# Patient Record
Sex: Male | Born: 1940 | Race: White | Hispanic: No | Marital: Married | Smoking: Former smoker
Health system: Southern US, Community
[De-identification: ages and names within clinical notes are randomized; demographics above are authoritative.]

## PROBLEM LIST (undated history)

## (undated) DIAGNOSIS — I2510 Atherosclerotic heart disease of native coronary artery without angina pectoris: Secondary | ICD-10-CM

## (undated) DIAGNOSIS — I10 Essential (primary) hypertension: Secondary | ICD-10-CM

## (undated) DIAGNOSIS — H547 Unspecified visual loss: Secondary | ICD-10-CM

## (undated) DIAGNOSIS — I509 Heart failure, unspecified: Secondary | ICD-10-CM

## (undated) HISTORY — PX: EYE SURGERY: SHX253

---

## 2021-04-19 ENCOUNTER — Emergency Department (HOSPITAL_COMMUNITY): Payer: Medicare Other

## 2021-04-19 ENCOUNTER — Inpatient Hospital Stay (HOSPITAL_COMMUNITY)
Admission: EM | Admit: 2021-04-19 | Discharge: 2021-04-22 | DRG: 445 | Disposition: A | Discharge status: Home / Self Care | Payer: Medicare Other | Attending: Family Medicine | Admitting: Family Medicine

## 2021-04-19 ENCOUNTER — Encounter (HOSPITAL_COMMUNITY): Payer: Self-pay

## 2021-04-19 ENCOUNTER — Inpatient Hospital Stay (HOSPITAL_COMMUNITY): Payer: Medicare Other

## 2021-04-19 ENCOUNTER — Other Ambulatory Visit: Payer: Self-pay

## 2021-04-19 DIAGNOSIS — Z800 Family history of malignant neoplasm of digestive organs: Secondary | ICD-10-CM | POA: Diagnosis not present

## 2021-04-19 DIAGNOSIS — Z880 Allergy status to penicillin: Secondary | ICD-10-CM | POA: Diagnosis not present

## 2021-04-19 DIAGNOSIS — E785 Hyperlipidemia, unspecified: Secondary | ICD-10-CM

## 2021-04-19 DIAGNOSIS — R932 Abnormal findings on diagnostic imaging of liver and biliary tract: Secondary | ICD-10-CM | POA: Diagnosis present

## 2021-04-19 DIAGNOSIS — R109 Unspecified abdominal pain: Secondary | ICD-10-CM | POA: Diagnosis not present

## 2021-04-19 DIAGNOSIS — Z20822 Contact with and (suspected) exposure to covid-19: Secondary | ICD-10-CM | POA: Diagnosis present

## 2021-04-19 DIAGNOSIS — K2270 Barrett's esophagus without dysplasia: Secondary | ICD-10-CM | POA: Diagnosis present

## 2021-04-19 DIAGNOSIS — H409 Unspecified glaucoma: Secondary | ICD-10-CM | POA: Diagnosis present

## 2021-04-19 DIAGNOSIS — K8689 Other specified diseases of pancreas: Secondary | ICD-10-CM | POA: Diagnosis present

## 2021-04-19 DIAGNOSIS — K2970 Gastritis, unspecified, without bleeding: Secondary | ICD-10-CM | POA: Diagnosis present

## 2021-04-19 DIAGNOSIS — I10 Essential (primary) hypertension: Secondary | ICD-10-CM | POA: Diagnosis present

## 2021-04-19 DIAGNOSIS — K219 Gastro-esophageal reflux disease without esophagitis: Secondary | ICD-10-CM | POA: Diagnosis present

## 2021-04-19 DIAGNOSIS — K831 Obstruction of bile duct: Principal | ICD-10-CM | POA: Diagnosis present

## 2021-04-19 DIAGNOSIS — I2510 Atherosclerotic heart disease of native coronary artery without angina pectoris: Secondary | ICD-10-CM | POA: Diagnosis present

## 2021-04-19 DIAGNOSIS — N132 Hydronephrosis with renal and ureteral calculous obstruction: Secondary | ICD-10-CM | POA: Diagnosis present

## 2021-04-19 DIAGNOSIS — Z8249 Family history of ischemic heart disease and other diseases of the circulatory system: Secondary | ICD-10-CM | POA: Diagnosis not present

## 2021-04-19 DIAGNOSIS — H547 Unspecified visual loss: Secondary | ICD-10-CM | POA: Diagnosis present

## 2021-04-19 DIAGNOSIS — E876 Hypokalemia: Secondary | ICD-10-CM | POA: Diagnosis present

## 2021-04-19 DIAGNOSIS — N400 Enlarged prostate without lower urinary tract symptoms: Secondary | ICD-10-CM | POA: Diagnosis present

## 2021-04-19 DIAGNOSIS — R7989 Other specified abnormal findings of blood chemistry: Secondary | ICD-10-CM | POA: Diagnosis present

## 2021-04-19 DIAGNOSIS — R911 Solitary pulmonary nodule: Secondary | ICD-10-CM | POA: Diagnosis present

## 2021-04-19 DIAGNOSIS — Z87891 Personal history of nicotine dependence: Secondary | ICD-10-CM

## 2021-04-19 DIAGNOSIS — N179 Acute kidney failure, unspecified: Secondary | ICD-10-CM

## 2021-04-19 DIAGNOSIS — Z419 Encounter for procedure for purposes other than remedying health state, unspecified: Secondary | ICD-10-CM

## 2021-04-19 DIAGNOSIS — C259 Malignant neoplasm of pancreas, unspecified: Secondary | ICD-10-CM | POA: Diagnosis present

## 2021-04-19 DIAGNOSIS — R52 Pain, unspecified: Secondary | ICD-10-CM

## 2021-04-19 HISTORY — DX: Atherosclerotic heart disease of native coronary artery without angina pectoris: I25.10

## 2021-04-19 HISTORY — DX: Unspecified visual loss: H54.7

## 2021-04-19 HISTORY — DX: Essential (primary) hypertension: I10

## 2021-04-19 HISTORY — DX: Heart failure, unspecified: I50.9

## 2021-04-19 LAB — URINALYSIS, ROUTINE W REFLEX MICROSCOPIC
Glucose, UA: NEGATIVE mg/dL
Ketones, ur: 5 mg/dL — AB
Leukocytes,Ua: NEGATIVE
Nitrite: NEGATIVE
Protein, ur: 30 mg/dL — AB
RBC / HPF: >50 RBC/hpf — ABNORMAL HIGH (ref 0–5)
Specific Gravity, Urine: 1.017 (ref 1.005–1.030)
pH: 5 (ref 5.0–8.0)

## 2021-04-19 LAB — COMPREHENSIVE METABOLIC PANEL
ALT: 549 U/L — ABNORMAL HIGH (ref 0–44)
AST: 554 U/L — ABNORMAL HIGH (ref 15–41)
Albumin: 3.8 g/dL (ref 3.5–5.0)
Alkaline Phosphatase: 647 U/L — ABNORMAL HIGH (ref 38–126)
Anion gap: 9 (ref 5–15)
BUN: 25 mg/dL — ABNORMAL HIGH (ref 8–23)
CO2: 27 mmol/L (ref 22–32)
Calcium: 8.8 mg/dL — ABNORMAL LOW (ref 8.9–10.3)
Chloride: 107 mmol/L (ref 98–111)
Creatinine, Ser: 1.76 mg/dL — ABNORMAL HIGH (ref 0.61–1.24)
GFR, Estimated: 39 mL/min — ABNORMAL LOW (ref >60)
Glucose, Bld: 139 mg/dL — ABNORMAL HIGH (ref 70–99)
Potassium: 3 mmol/L — ABNORMAL LOW (ref 3.5–5.1)
Sodium: 143 mmol/L (ref 135–145)
Total Bilirubin: 8.6 mg/dL — ABNORMAL HIGH (ref 0.3–1.2)
Total Protein: 7.4 g/dL (ref 6.5–8.1)

## 2021-04-19 LAB — CBC WITH DIFFERENTIAL/PLATELET
Abs Immature Granulocytes: 0.02 10*3/uL (ref 0.00–0.07)
Basophils Absolute: 0 10*3/uL (ref 0.0–0.1)
Basophils Relative: 1 %
Eosinophils Absolute: 0.1 10*3/uL (ref 0.0–0.5)
Eosinophils Relative: 1 %
HCT: 37.9 % — ABNORMAL LOW (ref 39.0–52.0)
Hemoglobin: 12.8 g/dL — ABNORMAL LOW (ref 13.0–17.0)
Immature Granulocytes: 0 %
Lymphocytes Relative: 14 %
Lymphs Abs: 0.9 10*3/uL (ref 0.7–4.0)
MCH: 31.4 pg (ref 26.0–34.0)
MCHC: 33.8 g/dL (ref 30.0–36.0)
MCV: 93.1 fL (ref 80.0–100.0)
Monocytes Absolute: 0.8 10*3/uL (ref 0.1–1.0)
Monocytes Relative: 12 %
Neutro Abs: 4.9 10*3/uL (ref 1.7–7.7)
Neutrophils Relative %: 72 %
Platelets: 151 10*3/uL (ref 150–400)
RBC: 4.07 MIL/uL — ABNORMAL LOW (ref 4.22–5.81)
RDW: 13.7 % (ref 11.5–15.5)
WBC: 6.8 10*3/uL (ref 4.0–10.5)
nRBC: 0 % (ref 0.0–0.2)

## 2021-04-19 MED ORDER — POTASSIUM CHLORIDE 10 MEQ/100ML IV SOLN
10.0000 meq | INTRAVENOUS | Status: AC
  Administered 2021-04-20 (×3): 10 meq via INTRAVENOUS
  Filled 2021-04-19 (×4): qty 100

## 2021-04-19 MED ORDER — TAMSULOSIN HCL 0.4 MG PO CAPS
0.4000 mg | ORAL_CAPSULE | Freq: Every day | ORAL | Status: DC
  Administered 2021-04-20 – 2021-04-21 (×2): 0.4 mg via ORAL
  Filled 2021-04-19 (×3): qty 1

## 2021-04-19 MED ORDER — SODIUM CHLORIDE 0.9 % IV BOLUS
500.0000 mL | Freq: Once | INTRAVENOUS | Status: AC
  Administered 2021-04-19: 500 mL via INTRAVENOUS

## 2021-04-19 NOTE — ED Provider Notes (Signed)
[facility] DEPT Provider Note   CSN: [id number] Arrival date & time: 04/19/21  1919     History Chief Complaint  Patient presents with  . Flank Pain    Maurice Schneider is a 80 y.o. male.  Patient presents with flank pain for couple days and dark urine.  He has also had some abdominal discomfort for the last week and a half   Flank Pain       Past Medical History:  Diagnosis Date  . Blind   . CHF (congestive heart failure) ([place])   . Coronary artery disease   . Hypertension     Patient Active Problem List   Diagnosis Date Noted  . Hydronephrosis with renal and ureteral calculus obstruction 04/19/2021  . Pancreatic mass 04/19/2021  . Hypokalemia 04/19/2021  . AKI (acute kidney injury) ([place]) 04/19/2021  . Elevated LFTs 04/19/2021    History reviewed. No pertinent surgical history.     History reviewed. No pertinent family history.     Home Medications Prior to Admission medications   Not on File    Allergies    Penicillins  Review of Systems   Review of Systems  Genitourinary: Positive for flank pain.    Physical Exam Updated Vital Signs BP (!) 146/76   Pulse 62   Temp 97.8 F (36.6 C) (Oral)   Resp 18   Ht 5' 6.5" (1.689 m)   Wt 77.1 kg   SpO2 97%   BMI 27.03 kg/m   Physical Exam  ED Results / Procedures / Treatments   Labs (all labs ordered are listed, but only abnormal results are displayed) Labs Reviewed  CBC WITH DIFFERENTIAL/PLATELET - Abnormal; Notable for the following components:      Result Value   RBC 4.07 (*)    Hemoglobin 12.8 (*)    HCT 37.9 (*)    All other components within normal limits  COMPREHENSIVE METABOLIC PANEL - Abnormal; Notable for the following components:   Potassium 3.0 (*)    Glucose, Bld 139 (*)    BUN 25 (*)    Creatinine, Ser 1.76 (*)    Calcium 8.8 (*)    AST 554 (*)    ALT 549 (*)    Alkaline Phosphatase 647 (*)    Total Bilirubin 8.6 (*)    GFR, Estimated  39 (*)    All other components within normal limits  URINALYSIS, ROUTINE W REFLEX MICROSCOPIC - Abnormal; Notable for the following components:   Color, Urine AMBER (*)    APPearance HAZY (*)    Hgb urine dipstick MODERATE (*)    Bilirubin Urine SMALL (*)    Ketones, ur 5 (*)    Protein, ur 30 (*)    RBC / HPF >50 (*)    Bacteria, UA RARE (*)    All other components within normal limits  SARS CORONAVIRUS 2 (TAT 6-24 HRS)  URINE CULTURE  MAGNESIUM  CK  LIPASE, BLOOD  SODIUM, URINE, RANDOM  OSMOLALITY, URINE  CREATININE, URINE, RANDOM    EKG None  Radiology CT ABDOMEN PELVIS WO CONTRAST  Result Date: 04/19/2021 CLINICAL DATA:  Abdominal abscess/infection suspected Flank pain with dark urine EXAM: CT ABDOMEN AND PELVIS WITHOUT CONTRAST TECHNIQUE: Multidetector CT imaging of the abdomen and pelvis was performed following the standard protocol without IV contrast. COMPARISON:  None. FINDINGS: Lower chest: Peripheral honeycombing versus emphysema in the right lower lobe. Mild bilateral lower lobe bronchiectasis. No acute airspace disease. Normal heart size with coronary  artery calcifications. Hepatobiliary: No focal liver abnormality on this noncontrast exam. Diffuse high-density contents of the gallbladder, may be stones/sludge. No pericholecystic fat stranding or inflammation. There is proximal common bile duct dilatation measuring up to 17 mm with truncation distally. No visualized choledocholithiasis. Pancreas: Parenchymal atrophy. There is rounded soft tissue fullness in the pancreatic head spanning 2.9 cm. Mild adjacent fat stranding about the pancreatic head and uncinate process. Proximal pancreatic ductal dilatation at 5 mm. Spleen: Normal in size without focal abnormality. Adrenals/Urinary Tract: Normal adrenal glands. Mild right hydroureteronephrosis secondary to a 4 mm obstructing stone in the right mid ureter, series 2, image 50. Minimal right perinephric edema. More distal ureters  decompressed. No definite additional intrarenal calculi or focal intrarenal lesion. Cystic changes at the left renal hilum likely represent cortical and parapelvic cysts. This includes an exophytic cyst arising from the lower kidney measuring 9.6 cm. Left intrarenal calculi, largest in the mid kidney measures 8 mm. No ureteral stone. Partially distended urinary bladder without bladder stone or wall thickening. Stomach/Bowel: Small hiatal hernia. Ingested material distends the stomach. No small bowel obstruction or inflammation. Normal appendix. Diverticulosis of the descending and sigmoid colon, prominent in the sigmoid. No diverticulitis. Vascular/Lymphatic: Moderate aortic atherosclerosis without aneurysm. There may be a small peripancreatic nodes, not well assessed on this noncontrast exam. Reproductive: Prominent prostate gland spans 4.8 cm and causes mild mass effect on the bladder base. Other: No ascites or free air. Tiny fat containing umbilical hernia. 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 No acute osseous abnormality or evidence of focal bone lesion. IMPRESSION: 1. Mild right hydroureteronephrosis secondary to a 4 mm obstructing stone in the right mid ureter. 2. Rounded soft tissue fullness in the pancreatic head spanning 2.9 cm with mild adjacent fat stranding about the pancreatic head and uncinate process. Recommend further evaluation with pancreatic protocol MRI after resolution of acute event. There is proximal pancreatic as well as common bile duct dilatation. There may be a small peripancreatic nodes, not well assessed on this noncontrast exam. 3. High-density contents of the gallbladder, may be stones/sludge. No pericholecystic fat stranding or inflammation. 4. Left intrarenal calculi. Left renal cortical and parapelvic cysts. 5. Colonic diverticulosis without diverticulitis. 6. Enlarged prostate gland causing  mild mass effect on the bladder base. 7. 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 Aortic Atherosclerosis (ICD10-I70.0). Electronically Signed   By: Keith Rake M.D.   On: 04/19/2021 20:51    Procedures Procedures   Medications Ordered in ED Medications  tamsulosin (FLOMAX) capsule 0.4 mg (has no administration in time range)  potassium chloride 10 mEq in 100 mL IVPB (has no administration in time range)  sodium chloride 0.9 % bolus 500 mL (0 mLs Intravenous Stopped 04/19/21 2306)    ED Course  I have reviewed the triage vital signs and the nursing notes.  Pertinent labs & imaging results that were available during my care of the patient were reviewed by me and considered in my medical decision making (see chart for details).    MDM Rules/Calculators/A&P                          Patient has a kidney stone and a pancreatic mass with hyperbilirubinemia.  He will be admitted to medicine with GI consult and possible urology involved to Final Clinical Impression(s) / ED Diagnoses Final diagnoses:  Flank  pain    Rx / DC Orders ED Discharge Orders    None       Milton Ferguson, MD 04/25/21 1245

## 2021-04-19 NOTE — H&P (Signed)
Maurice Schneider FVC:[id number] DOB: [birth year] DOA: 04/19/2021    PCP: Administration, [place] [facility] Specialists:  NONE    Patient arrived to ER on 04/19/21 at 1919 Referred by Attending Maurice Ferguson, MD   Patient coming from: home Lives  With family    Chief Complaint:  Chief Complaint  Patient presents with  . Flank Pain    HPI: Maurice Schneider is a 80 y.o. male with medical history significant of glaucoma, HTN, HLD, barrets esophagus, kidney stones, blindness, CAD    Presented with few days of dark urine and flank pain Also had epigastric pain for about 1 wk Recently moved here from [place] No fever or chills Lost about 5 lb Last coloscopy was 6 y ago and was normal done in [place] Just today was checked for COVID and was negative Has   been vaccinated against COVID  and boosted   Initial COVID TEST   in house  PCR testing  Pending  No results found for: SARSCOV2NAA   Regarding pertinent Chronic problems:    Hyperlipidemia -  on statins lipitor Lipid Panel  No results found for: CHOL, TRIG, HDL, CHOLHDL, VLDL, LDLCALC, LDLDIRECT, LABVLDL  HTN on metoprolol, losartan  GERD - on PPI   CAD  - On Aspirin, statin, betablocker,                  -  followed by cardiology in [place]                - last cardiac cath sp 3 stents         CKD family states he had neck nephritis as a child but is unsure if he has any underlying kidney disease currently       While in ER: UA worrisome for UTI CT abdomen done showing 4 mm obstructing stone in the right mid ureter But there was also noted air rounded soft tissue fullness in the pancreatic head 2.9 cm will need follow-up MRI Enlarged prostate    ED Triage Vitals  Enc Vitals Group     BP 04/19/21 1929 (!) 150/75     Pulse Rate 04/19/21 1929 60     Resp 04/19/21 1929 14     Temp 04/19/21 1929 97.8 F (36.6 C)     Temp Source 04/19/21 1929 Oral     SpO2 04/19/21 1929 98 %      Weight 04/19/21 1929 170 lb (77.1 kg)     Height 04/19/21 1929 5' 6.5" (1.689 m)     Head Circumference --      Peak Flow --      Pain Score 04/19/21 1935 0     Pain Loc --      Pain Edu? --      Excl. in [place]? --   TMAX(24)@     _________________________________________ Significant initial  Findings: Abnormal Labs Reviewed  CBC WITH DIFFERENTIAL/PLATELET - Abnormal; Notable for the following components:      Result Value   RBC 4.07 (*)    Hemoglobin 12.8 (*)    HCT 37.9 (*)    All other components within normal limits  COMPREHENSIVE METABOLIC PANEL - Abnormal; Notable for the following components:   Potassium 3.0 (*)    Glucose, Bld 139 (*)    BUN 25 (*)    Creatinine, Ser 1.76 (*)    Calcium 8.8 (*)    AST 554 (*)    ALT 549 (*)  Alkaline Phosphatase 647 (*)    Total Bilirubin 8.6 (*)    GFR, Estimated 39 (*)    All other components within normal limits  URINALYSIS, ROUTINE W REFLEX MICROSCOPIC - Abnormal; Notable for the following components:   Color, Urine AMBER (*)    APPearance HAZY (*)    Hgb urine dipstick MODERATE (*)    Bilirubin Urine SMALL (*)    Ketones, ur 5 (*)    Protein, ur 30 (*)    RBC / HPF >50 (*)    Bacteria, UA RARE (*)    All other components within normal limits   ____________________________________________ Ordered    CXR -9 mm right upper lobe pulmonary nodule  CTabd/pelvis - Mild right hydroureteronephrosis secondary to a 4 mm obstructing stone in the right mid ureter Rounded soft tissue fullness in the pancreatic head spanning 2.9 cm with mild adjacent fat stranding about the pancreatic head and uncinate process. Recommend further evaluation with pancreatic protocol MRI after resolution of acute event. There is proximal pancreatic as well as common bile duct dilatation. There may be a small peripancreatic nodes, not well assessed on this noncontrast exam.     ECG: Ordered Personally reviewed by me showing: HR : 65 Rhythm:  NSR,    nonspecific changes,   [id number]   The recent clinical data is shown below. Vitals:   04/19/21 1929 04/19/21 1945 04/19/21 2134 04/19/21 2230  BP: (!) 150/75 133/74 130/66 (!) 146/76  Pulse: 60 60 63 62  Resp: 14 16 18 18   Temp: 97.8 F (36.6 C)     TempSrc: Oral     SpO2: 98% 94% 97% 97%  Weight: 77.1 kg     Height: 5' 6.5" (1.689 m)       WBC     Component Value Date/Time   WBC 6.8 04/19/2021 2059   LYMPHSABS 0.9 04/19/2021 2059   MONOABS 0.8 04/19/2021 2059   EOSABS 0.1 04/19/2021 2059   BASOSABS 0.0 04/19/2021 2059      Procalcitonin   Ordered   UA   no evidence of UTI    Urine analysis:    Component Value Date/Time   COLORURINE AMBER (A) 04/19/2021 2134   APPEARANCEUR HAZY (A) 04/19/2021 2134   LABSPEC 1.017 04/19/2021 2134   PHURINE 5.0 04/19/2021 2134   GLUCOSEU NEGATIVE 04/19/2021 2134   HGBUR MODERATE (A) 04/19/2021 2134   BILIRUBINUR SMALL (A) 04/19/2021 2134   KETONESUR 5 (A) 04/19/2021 2134   PROTEINUR 30 (A) 04/19/2021 2134   NITRITE NEGATIVE 04/19/2021 2134   LEUKOCYTESUR NEGATIVE 04/19/2021 2134   Urince culture ordered No results found for this or any previous visit.   _______________________________________________ Hospitalist was called for admission for pancreatic mass with elevated LFTs Right ureteral stone with mild hydronephrosis and AKI The following Work up has been ordered so far:  Orders Placed This Encounter  Procedures  . CT ABDOMEN PELVIS WO CONTRAST  . CBC with Differential/Platelet  . Comprehensive metabolic panel  . Urinalysis, Routine w reflex microscopic  . Consult to hospitalist    Following Medications were ordered in ER: Medications  sodium chloride 0.9 % bolus 500 mL (0 mLs Intravenous Stopped 04/19/21 2306)        Consult Orders  (From admission, onward)         Start     Ordered   04/19/21 2259  Consult to hospitalist  Once       Provider:  (Not yet assigned)  Question Answer Comment  Place call to:  Maurice Hospitalist,   call [phone]   Reason for Consult Admit      04/19/21 2259            OTHER Significant initial  Findings:  labs showing:    Recent Labs  Lab 04/19/21 2059  NA 143  K 3.0*  CO2 27  GLUCOSE 139*  BUN 25*  CREATININE 1.76*  CALCIUM 8.8*    Cr  Lab Results  Component Value Date   CREATININE 1.76 (H) 04/19/2021    Recent Labs  Lab 04/19/21 2059  AST 554*  ALT 549*  ALKPHOS 647*  BILITOT 8.6*  PROT 7.4  ALBUMIN 3.8   Lab Results  Component Value Date   CALCIUM 8.8 (L) 04/19/2021          Plt: Lab Results  Component Value Date   PLT 151 04/19/2021         Recent Labs  Lab 04/19/21 2059  WBC 6.8  NEUTROABS 4.9  HGB 12.8*  HCT 37.9*  MCV 93.1  PLT 151    HG/HCT stable,      Component Value Date/Time   HGB 12.8 (L) 04/19/2021 2059   HCT 37.9 (L) 04/19/2021 2059   MCV 93.1 04/19/2021 2059       Cultures: No results found for: SDES, SPECREQUEST, CULT, REPTSTATUS   Radiological Exams on Admission: CT ABDOMEN PELVIS WO CONTRAST  Result Date: 04/19/2021 CLINICAL DATA:  Abdominal abscess/infection suspected Flank pain with dark urine EXAM: CT ABDOMEN AND PELVIS WITHOUT CONTRAST TECHNIQUE: Multidetector CT imaging of the abdomen and pelvis was performed following the standard protocol without IV contrast. COMPARISON:  None. FINDINGS: Lower chest: Peripheral honeycombing versus emphysema in the right lower lobe. Mild bilateral lower lobe bronchiectasis. No acute airspace disease. Normal heart size with coronary artery calcifications. Hepatobiliary: No focal liver abnormality on this noncontrast exam. Diffuse high-density contents of the gallbladder, may be stones/sludge. No pericholecystic fat stranding or inflammation. There is proximal common bile duct dilatation measuring up to 17 mm with truncation distally. No visualized choledocholithiasis. Pancreas: Parenchymal atrophy. There is rounded soft tissue fullness in the pancreatic  head spanning 2.9 cm. Mild adjacent fat stranding about the pancreatic head and uncinate process. Proximal pancreatic ductal dilatation at 5 mm. Spleen: Normal in size without focal abnormality. Adrenals/Urinary Tract: Normal adrenal glands. Mild right hydroureteronephrosis secondary to a 4 mm obstructing stone in the right mid ureter, series 2, image 50. Minimal right perinephric edema. More distal ureters decompressed. No definite additional intrarenal calculi or focal intrarenal lesion. Cystic changes at the left renal hilum likely represent cortical and parapelvic cysts. This includes an exophytic cyst arising from the lower kidney measuring 9.6 cm. Left intrarenal calculi, largest in the mid kidney measures 8 mm. No ureteral stone. Partially distended urinary bladder without bladder stone or wall thickening. Stomach/Bowel: Small hiatal hernia. Ingested material distends the stomach. No small bowel obstruction or inflammation. Normal appendix. Diverticulosis of the descending and sigmoid colon, prominent in the sigmoid. No diverticulitis. Vascular/Lymphatic: Moderate aortic atherosclerosis without aneurysm. There may be a small peripancreatic nodes, not well assessed on this noncontrast exam. Reproductive: Prominent prostate gland spans 4.8 cm and causes mild mass effect on the bladder base. Other: No ascites or free air. Tiny fat containing umbilical hernia. 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 No acute osseous abnormality or evidence of focal bone lesion. IMPRESSION: 1. Mild right hydroureteronephrosis secondary to a 4  mm obstructing stone in the right mid ureter. 2. Rounded soft tissue fullness in the pancreatic head spanning 2.9 cm with mild adjacent fat stranding about the pancreatic head and uncinate process. Recommend further evaluation with pancreatic protocol MRI after resolution of acute event. There is proximal  pancreatic as well as common bile duct dilatation. There may be a small peripancreatic nodes, not well assessed on this noncontrast exam. 3. High-density contents of the gallbladder, may be stones/sludge. No pericholecystic fat stranding or inflammation. 4. Left intrarenal calculi. Left renal cortical and parapelvic cysts. 5. Colonic diverticulosis without diverticulitis. 6. Enlarged prostate gland causing mild mass effect on the bladder base. 7. 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 Aortic Atherosclerosis (ICD10-I70.0). Electronically Signed   By: Maurice Schneider M.D.   On: 04/19/2021 20:51   DG Chest 2 View  Result Date: 04/20/2021 CLINICAL DATA:  Pancreatic mass EXAM: CHEST - 2 VIEW COMPARISON:  None. FINDINGS: The lungs are symmetrically expanded. A 9 mm indeterminate nodule is seen within the a right apex. The lungs are otherwise clear. No pneumothorax or pleural effusion. Cardiac size within normal limits. Pulmonary vascularity is normal. Osseous structures are age-appropriate. No acute bone abnormality. IMPRESSION: 9 mm indeterminate right upper lobe pulmonary nodule. Dedicated CT imaging is recommended for further evaluation if no prior examinations are available to establish chronicity. Electronically Signed   By: Maurice Salisbury MD   On: 04/20/2021 00:21   _______________________________________________________________________________________________________ Latest  Blood pressure (!) 146/76, pulse 62, temperature 97.8 F (36.6 C), temperature source Oral, resp. rate 18, height 5' 6.5" (1.689 m), weight 77.1 kg, SpO2 97 %.   Review of Systems:    Pertinent positives include:   fatigue, weight loss  jaundice  Constitutional:  No weight loss, night sweats, Fevers, chills,  HEENT:  No headaches, Difficulty swallowing,Tooth/dental problems,Sore throat,  No sneezing, itching, ear ache, nasal congestion, post nasal drip,   Cardio-vascular:  No chest pain, Orthopnea, PND, anasarca, dizziness, palpitations.no Bilateral lower extremity swelling  GI:  No heartburn, indigestion, abdominal pain, nausea, vomiting, diarrhea, change in bowel habits, loss of appetite, melena, blood in stool, hematemesis Resp:  no shortness of breath at rest. No dyspnea on exertion, No excess mucus, no productive cough, No non-productive cough, No coughing up of blood.No change in color of mucus.No wheezing. Skin:  no rash or lesions. No GU:  no dysuria, change in color of urine, no urgency or frequency. No straining to urinate.  No flank pain.  Musculoskeletal:  No joint pain or no joint swelling. No decreased range of motion. No back pain.  Psych:  No change in mood or affect. No depression or anxiety. No memory loss.  Neuro: no localizing neurological complaints, no tingling, no weakness, no double vision, no gait abnormality, no slurred speech, no confusion  All systems reviewed and apart from [place] all are negative _______________________________________________________________________________________________ Past Medical History:   Past Medical History:  Diagnosis Date  . Blind   . CHF (congestive heart failure) ([place])   . Coronary artery disease   . Hypertension     History reviewed. No pertinent surgical history.  Social History:  Ambulatory   Independently      reports that he has quit smoking. He has never used smokeless tobacco. He reports current alcohol use. No history on file for drug use.     Family History:   Family History  Problem Relation Age of Onset  . Colon cancer Mother   .  CAD Mother    ______________________________________________________________________________________________ Allergies: Allergies  Allergen Reactions  . Penicillins Anaphylaxis     Prior to Admission medications   Not on File     ___________________________________________________________________________________________________ Physical Exam: Vitals with BMI 04/19/2021 04/19/2021 04/19/2021  Height - - -  Weight - - -  BMI - - -  Systolic [phone]  Diastolic 76 66 74  Pulse 62 63 60     1. General:  in No  Acute distress   Chronically ill -appearing jaundiced 2. Psychological: Alert and  Oriented 3. Head/ENT:     Dry Mucous Membranes                          Head Non traumatic, neck supple                            Poor Dentition 4. SKIN:   decreased Skin turgor,  Skin clean Dry and intact no rash 5. Heart: Regular rate and rhythm no  Murmur, no Rub or gallop 6. Lungs:  no wheezes or crackles   7. Abdomen: Soft,  non-tender, Non distended  bowel sounds present 8. Lower extremities: no clubbing, cyanosis, no  edema 9. Neurologically Grossly intact, moving all 4 extremities equally   10. MSK: Normal range of motion    Chart has been reviewed  ______________________________________________________________________________________________  Assessment/Plan  80 y.o. male with medical history significant of glaucoma, HTN, HLD, barrets esophagus, kidney stones, blindness, CAD  Admitted for  pancreatic mass with elevated LFTs Right ureteral stone with mild hydronephrosis and AKI  Present on Admission: . Hydronephrosis with renal and ureteral calculus obstruction - discussed with Urology, please reconsult in Am to make sure he is on the list Recommended Flomax IV fluid rehydration pain control  . Pancreatic mass -discussed with GI Dr. Paulita Schneider is aware will see in a.m. hold off on ordering MRCP for now  . Hypokalemia -will replace and check magnesium level  . AKI (acute kidney injury) ([place]) -unclear if CKD versus acute change.  Ordered urine electrolytes gently rehydrate and follow  . Elevated LFTs -most likely secondary to obstruction.  For completion we will obtain hepatitis serologies appreciate  GI  . Essential hypertension resume Coreg and Imdur continue to monitor hold off on ARB given possible AKI  . Hyperlipidemia -hold off on statins given elevated LFTs  . Pulmonary nodule will need further imaging if renal function improves after fluid rehydration and passing kidney stone considering CT with contrast for right now we will hold off   Other plan as per orders.  DVT prophylaxis:  SCD     Code Status:    Code Status: Not on file FULL CODE  as per patient   I had personally discussed CODE STATUS with patient and family    Family Communication:   Family  at  Bedside  plan of care was discussed  with   Wife  Disposition Plan:       To home once workup is complete and patient is stable   Following barriers for discharge:                            Electrolytes corrected  Pain controlled with PO medications                                                          Will need to be able to tolerate PO                                                      Will need consultants to evaluate patient prior to discharge                        Would benefit from PT/OT eval prior to DC  Ordered                                      Consults called:  Let Urology know at night please also call them in AM just to make sure they got the [place] to [place]   Admission status:  ED Disposition    ED Disposition Condition [place]: [facility] [[id number]]  Level of Care: Telemetry [5]  Admit to tele based on following criteria: Other see comments  Comments: hypokalemia  May admit patient to [facility] or [facility] if equivalent level of care is available:: No  Covid Evaluation: Asymptomatic Screening Protocol (No Symptoms)  Diagnosis: Pancreatic mass [[id number]]  Admitting Physician: Maurice Schneider [id number]]  Attending Physician: Maurice Schneider [[id number]]  Estimated length of stay:  past midnight tomorrow  Certification:: I certify this patient will need inpatient services for at least 2 midnights        Obs     Level of care     tele  For   24H     No results found for: SARSCOV2NAA   Precautions: admitted as  asymptomatic screening protocol   PPE: Used by the provider:   N95  eye Goggles,  Gloves     Maurice Schneider 04/20/2021, 1:25 AM    Maurice Schneider     after 2 AM please page floor coverage PA If 7AM-7PM, please contact the day team taking care of the patient using [email or web address]   Patient was evaluated in the context of the global COVID-19 pandemic, which necessitated consideration that the patient might be at risk for infection with the SARS-CoV-2 virus that causes COVID-19. Institutional protocols and algorithms that pertain to the evaluation of patients at risk for COVID-19 are in a state of rapid change based on information released by regulatory bodies including the CDC and federal and state organizations. These policies and algorithms were followed during the patient's care.

## 2021-04-19 NOTE — ED Triage Notes (Signed)
Pt reports flank pain for a few days with dark urine. Pt reports going to UC and had a UA done. UC suggested possible renal failure.

## 2021-04-20 ENCOUNTER — Encounter (HOSPITAL_COMMUNITY): Payer: Self-pay | Admitting: Internal Medicine

## 2021-04-20 ENCOUNTER — Inpatient Hospital Stay (HOSPITAL_COMMUNITY): Payer: Medicare Other

## 2021-04-20 DIAGNOSIS — R7989 Other specified abnormal findings of blood chemistry: Secondary | ICD-10-CM

## 2021-04-20 DIAGNOSIS — I10 Essential (primary) hypertension: Secondary | ICD-10-CM | POA: Diagnosis present

## 2021-04-20 DIAGNOSIS — R911 Solitary pulmonary nodule: Secondary | ICD-10-CM | POA: Diagnosis present

## 2021-04-20 DIAGNOSIS — E785 Hyperlipidemia, unspecified: Secondary | ICD-10-CM | POA: Diagnosis present

## 2021-04-20 LAB — COMPREHENSIVE METABOLIC PANEL
ALT: 483 U/L — ABNORMAL HIGH (ref 0–44)
AST: 491 U/L — ABNORMAL HIGH (ref 15–41)
Albumin: 3.2 g/dL — ABNORMAL LOW (ref 3.5–5.0)
Alkaline Phosphatase: 615 U/L — ABNORMAL HIGH (ref 38–126)
Anion gap: 6 (ref 5–15)
BUN: 23 mg/dL (ref 8–23)
CO2: 23 mmol/L (ref 22–32)
Calcium: 8.1 mg/dL — ABNORMAL LOW (ref 8.9–10.3)
Chloride: 114 mmol/L — ABNORMAL HIGH (ref 98–111)
Creatinine, Ser: 1.7 mg/dL — ABNORMAL HIGH (ref 0.61–1.24)
GFR, Estimated: 41 mL/min — ABNORMAL LOW (ref >60)
Glucose, Bld: 119 mg/dL — ABNORMAL HIGH (ref 70–99)
Potassium: 3.1 mmol/L — ABNORMAL LOW (ref 3.5–5.1)
Sodium: 143 mmol/L (ref 135–145)
Total Bilirubin: 7 mg/dL — ABNORMAL HIGH (ref 0.3–1.2)
Total Protein: 6.3 g/dL — ABNORMAL LOW (ref 6.5–8.1)

## 2021-04-20 LAB — CBC WITH DIFFERENTIAL/PLATELET
Abs Immature Granulocytes: 0.02 10*3/uL (ref 0.00–0.07)
Basophils Absolute: 0 10*3/uL (ref 0.0–0.1)
Basophils Relative: 1 %
Eosinophils Absolute: 0.1 10*3/uL (ref 0.0–0.5)
Eosinophils Relative: 2 %
HCT: 34.5 % — ABNORMAL LOW (ref 39.0–52.0)
Hemoglobin: 11.9 g/dL — ABNORMAL LOW (ref 13.0–17.0)
Immature Granulocytes: 0 %
Lymphocytes Relative: 16 %
Lymphs Abs: 1 10*3/uL (ref 0.7–4.0)
MCH: 32 pg (ref 26.0–34.0)
MCHC: 34.5 g/dL (ref 30.0–36.0)
MCV: 92.7 fL (ref 80.0–100.0)
Monocytes Absolute: 0.9 10*3/uL (ref 0.1–1.0)
Monocytes Relative: 15 %
Neutro Abs: 4 10*3/uL (ref 1.7–7.7)
Neutrophils Relative %: 66 %
Platelets: 144 10*3/uL — ABNORMAL LOW (ref 150–400)
RBC: 3.72 MIL/uL — ABNORMAL LOW (ref 4.22–5.81)
RDW: 13.6 % (ref 11.5–15.5)
WBC: 6 10*3/uL (ref 4.0–10.5)
nRBC: 0 % (ref 0.0–0.2)

## 2021-04-20 LAB — LIPASE, BLOOD: Lipase: 44 U/L (ref 11–51)

## 2021-04-20 LAB — PHOSPHORUS: Phosphorus: 2.5 mg/dL (ref 2.5–4.6)

## 2021-04-20 LAB — HEPATITIS PANEL, ACUTE
HCV Ab: NONREACTIVE
Hep A IgM: NONREACTIVE
Hep B C IgM: NONREACTIVE
Hepatitis B Surface Ag: NONREACTIVE

## 2021-04-20 LAB — OSMOLALITY, URINE: Osmolality, Ur: 439 mosm/kg (ref 300–900)

## 2021-04-20 LAB — CREATININE, URINE, RANDOM: Creatinine, Urine: 188.05 mg/dL

## 2021-04-20 LAB — SODIUM, URINE, RANDOM: Sodium, Ur: 34 mmol/L

## 2021-04-20 LAB — MAGNESIUM
Magnesium: 1.8 mg/dL (ref 1.7–2.4)
Magnesium: 1.9 mg/dL (ref 1.7–2.4)

## 2021-04-20 LAB — CK: Total CK: 208 U/L (ref 49–397)

## 2021-04-20 LAB — TSH: TSH: 2.696 u[IU]/mL (ref 0.350–4.500)

## 2021-04-20 LAB — SARS CORONAVIRUS 2 (TAT 6-24 HRS): SARS Coronavirus 2: NEGATIVE

## 2021-04-20 MED ORDER — METOPROLOL TARTRATE 12.5 MG HALF TABLET
12.5000 mg | ORAL_TABLET | Freq: Two times a day (BID) | ORAL | Status: DC
  Administered 2021-04-20 (×2): 12.5 mg via ORAL
  Filled 2021-04-20 (×2): qty 1

## 2021-04-20 MED ORDER — FENTANYL CITRATE (PF) 100 MCG/2ML IJ SOLN: 12.5000 ug | INTRAMUSCULAR | Status: DC | PRN

## 2021-04-20 MED ORDER — PANTOPRAZOLE SODIUM 40 MG PO TBEC
40.0000 mg | DELAYED_RELEASE_TABLET | Freq: Every day | ORAL | Status: DC
  Administered 2021-04-22: 40 mg via ORAL
  Filled 2021-04-20: qty 1

## 2021-04-20 MED ORDER — SODIUM CHLORIDE 0.9 % IV SOLN: INTRAVENOUS | Status: DC

## 2021-04-20 MED ORDER — ACETAMINOPHEN 650 MG RE SUPP: 650.0000 mg | Freq: Four times a day (QID) | RECTAL | Status: DC | PRN

## 2021-04-20 MED ORDER — HYDROCODONE-ACETAMINOPHEN 5-325 MG PO TABS: 1.0000 | ORAL_TABLET | ORAL | Status: DC | PRN

## 2021-04-20 MED ORDER — ONDANSETRON HCL 4 MG PO TABS: 4.0000 mg | ORAL_TABLET | Freq: Four times a day (QID) | ORAL | Status: DC | PRN

## 2021-04-20 MED ORDER — DORZOLAMIDE HCL-TIMOLOL MAL 2-0.5 % OP SOLN
1.0000 [drp] | Freq: Two times a day (BID) | OPHTHALMIC | Status: DC
  Administered 2021-04-20 – 2021-04-22 (×4): 1 [drp] via OPHTHALMIC
  Filled 2021-04-20: qty 10

## 2021-04-20 MED ORDER — GADOBUTROL 1 MMOL/ML IV SOLN
8.0000 mL | Freq: Once | INTRAVENOUS | Status: AC | PRN
  Administered 2021-04-20: 8 mL via INTRAVENOUS

## 2021-04-20 MED ORDER — ONDANSETRON HCL 4 MG/2ML IJ SOLN: 4.0000 mg | Freq: Four times a day (QID) | INTRAMUSCULAR | Status: DC | PRN

## 2021-04-20 MED ORDER — ACETAMINOPHEN 325 MG PO TABS
650.0000 mg | ORAL_TABLET | Freq: Four times a day (QID) | ORAL | Status: DC | PRN
  Administered 2021-04-20 – 2021-04-21 (×4): 650 mg via ORAL
  Filled 2021-04-20 (×4): qty 2

## 2021-04-20 MED ORDER — ATORVASTATIN CALCIUM 40 MG PO TABS: 40.0000 mg | ORAL_TABLET | Freq: Every day | ORAL | Status: DC

## 2021-04-20 MED ORDER — PREDNISOLONE ACETATE 1 % OP SUSP
1.0000 [drp] | Freq: Four times a day (QID) | OPHTHALMIC | Status: DC
  Administered 2021-04-20 – 2021-04-22 (×6): 1 [drp] via OPHTHALMIC
  Filled 2021-04-20 (×2): qty 5

## 2021-04-20 MED ORDER — ISOSORBIDE MONONITRATE ER 60 MG PO TB24
60.0000 mg | ORAL_TABLET | Freq: Every day | ORAL | Status: DC
  Administered 2021-04-21 – 2021-04-22 (×2): 60 mg via ORAL
  Filled 2021-04-20 (×3): qty 1

## 2021-04-20 MED ORDER — POTASSIUM CHLORIDE 20 MEQ PO PACK
40.0000 meq | PACK | Freq: Once | ORAL | Status: DC
  Filled 2021-04-20: qty 2

## 2021-04-20 NOTE — ED Notes (Signed)
Urine strained.  No object observed in strainer

## 2021-04-20 NOTE — ED Notes (Signed)
Verified with lab pt's morning labs had been drawn, received and processed.

## 2021-04-20 NOTE — ED Notes (Signed)
Pt up and ambulatory to bathroom with ED tech accompanying. Pt's linens changed.

## 2021-04-20 NOTE — Plan of Care (Signed)
  Problem: Clinical Measurements: Goal: Ability to maintain clinical measurements within normal limits will improve Outcome: Progressing Goal: Will remain free from infection Outcome: Progressing Goal: Diagnostic test results will improve Outcome: Progressing Goal: Respiratory complications will improve Outcome: Progressing Goal: Cardiovascular complication will be avoided Outcome: Progressing   Problem: Education: Goal: Knowledge of General Education information will improve Description: Including pain rating scale, medication(s)/side effects and non-pharmacologic comfort measures Outcome: Progressing   Problem: Health Behavior/Discharge Planning: Goal: Ability to manage health-related needs will improve Outcome: Progressing   

## 2021-04-20 NOTE — ED Notes (Signed)
Report received from nightshift RN. Pt sitting upright in ED stretcher. Attached to cardiac monitor x2. VSS.

## 2021-04-20 NOTE — ED Notes (Signed)
Pt back from bathroom. Re-attached to cardiac monitor x3. VSS. GI at the bedside.

## 2021-04-20 NOTE — Consult Note (Signed)
Referring Provider: Dr. 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 male with history of glaucoma s/p blindness, Barrett's esophagus s/p ablation, H. 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  Patient presented to the ED due to decreased urinary frequency, flank pain, and fatigue.  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  Patient reports lower abdominal pain in association with urinary symptoms but denies any upper abdominal pain.  Reports nausea for the last 3 days but denies any vomiting.  Reports approximately 5 pound weight loss in the past month.  Reports some intermittent dysphagia secondary to prior Barrett's esophagus with ablation.  No known changes in bowel habits, though patient notes that he is blind and cannot tell what color her stools are.  He reports colonoscopy and EGD approximately 6 years ago at the [place].  He states he believes his colonoscopy was normal, though he has had polyps removed in the past.  On EGD, he was found to have H. pylori gastritis, as well as Barrett's esophagus and subsequently underwent ablation.  Denies any aspirin, NSAID, or blood thinner use.  Family history pertinent for mother with colon cancer.  Past Medical History:  Diagnosis Date  . Blind   . CHF (congestive heart failure) ([place])   . Coronary artery disease   . Hypertension     History reviewed. No pertinent surgical history.  Prior to Admission medications   Medication Sig Start Date End Date Taking? Authorizing Provider  amLODipine (NORVASC) 5 MG tablet Take 5 mg by mouth daily.   Yes [provider]  atorvastatin (LIPITOR) 40 MG tablet Take 40 mg by mouth daily.   Yes  [provider]  cetirizine (ZYRTEC) 10 MG tablet Take 10 mg by mouth daily.   Yes [provider]  dorzolamide-timolol (COSOPT) 22.3-6.8 MG/ML ophthalmic solution Place 1 drop into the left eye 2 (two) times daily.   Yes [provider]  isosorbide mononitrate (IMDUR) 60 MG 24 hr tablet Take 60 mg by mouth daily.   Yes [provider]  losartan (COZAAR) 50 MG tablet Take 50 mg by mouth daily.   Yes [provider]  metoprolol tartrate (LOPRESSOR) 25 MG tablet Take 12.5 mg by mouth 2 (two) times daily.   Yes [provider]  omeprazole (PRILOSEC) 20 MG capsule Take 20 mg by mouth daily.   Yes [provider]  prednisoLONE acetate (PRED FORTE) 1 % ophthalmic suspension Place 1 drop into both eyes 4 (four) times daily.   Yes [provider]    Scheduled Meds: . dorzolamide-timolol  1 drop Left Eye BID  . isosorbide mononitrate  60 mg Oral Daily  . metoprolol tartrate  12.5 mg Oral BID  . pantoprazole  40 mg Oral Daily  . potassium chloride  40 mEq Oral Once  . prednisoLONE acetate  1 drop Both Eyes QID  . tamsulosin  0.4 mg Oral QPC supper   Continuous Infusions: . sodium chloride 100 mL/hr at 04/20/21 0139   PRN Meds:.acetaminophen **OR** acetaminophen, fentaNYL (SUBLIMAZE) injection, HYDROcodone-acetaminophen, ondansetron **OR** ondansetron (ZOFRAN) IV  Allergies as of 04/19/2021 - Review Complete 04/19/2021  Allergen Reaction Noted  . Penicillins Anaphylaxis 04/19/2021    Family History  Problem Relation Age of Onset  . Colon cancer Mother   .  CAD Mother     Social History   Socioeconomic History  . Marital status: Married    Spouse name: Not on file  . Number of children: Not on file  . Years of education: Not on file  . Highest education level: Not on file  Occupational History  . Not on file  Tobacco Use  . Smoking status: Former Research scientist (life sciences)  . Smokeless tobacco: Never Used  Substance and Sexual  Activity  . Alcohol use: Yes    Comment: 1/4 brandy a day  . Drug use: Not on file  . Sexual activity: Not on file  Other Topics Concern  . Not on file  Social History Narrative  . Not on file   Social Determinants of Health   Financial Resource Strain: Not on file  Food Insecurity: Not on file  Transportation Needs: Not on file  Physical Activity: Not on file  Stress: Not on file  Social Connections: Not on file  Intimate Partner Violence: Not on file    Review of Systems: Review of Systems  Constitutional: Positive for malaise/fatigue and weight loss (5 lb). Negative for chills and fever.  HENT: Negative for hearing loss and tinnitus.   Eyes: Negative for pain.       Blindness  Respiratory: Negative for cough and shortness of breath.   Cardiovascular: Negative for chest pain and palpitations.  Gastrointestinal: Positive for nausea. Negative for abdominal pain, blood in stool, constipation, diarrhea, heartburn, melena and vomiting.  Genitourinary: Positive for dysuria, flank pain and frequency (decreased).  Musculoskeletal: Positive for back pain. Negative for falls.  Skin: Negative for itching and rash.  Neurological: Negative for seizures and loss of consciousness.  Endo/Heme/Allergies: Negative for polydipsia. Does not bruise/bleed easily.  Psychiatric/Behavioral: Negative for substance abuse. The patient is not nervous/anxious.      Physical Exam: Vital signs: Vitals:   04/20/21 0600 04/20/21 0855  BP: (!) 149/67 (!) 157/73  Pulse: (!) 54 (!) 56  Resp: 15 15  Temp:  98.3 F (36.8 C)  SpO2: 96% 95%     Physical Exam Vitals reviewed.  Constitutional:      General: He is not in acute distress. HENT:     Head: Normocephalic and atraumatic.     Nose: Nose normal. No congestion.     Mouth/Throat:     Mouth: Mucous membranes are moist.     Pharynx: Oropharynx is clear.  Eyes:     General: Scleral icterus present.     Extraocular Movements: Extraocular  movements intact.  Cardiovascular:     Rate and Rhythm: Regular rhythm. Bradycardia present.     Pulses: Normal pulses.  Pulmonary:     Effort: Pulmonary effort is normal. No respiratory distress.  Abdominal:     General: Bowel sounds are normal. There is no distension.     Palpations: Abdomen is soft. There is no mass.     Tenderness: There is abdominal tenderness (LLQ, RLQ). There is no guarding or rebound.     Hernia: No hernia is present.  Musculoskeletal:        General: No swelling or tenderness.     Cervical back: Normal range of motion and neck supple.  Skin:    General: Skin is warm and dry.     Coloration: Skin is jaundiced.  Neurological:     General: No focal deficit present.     Mental Status: He is alert and oriented to person, place, and time.  Psychiatric:  Mood and Affect: Mood normal.        Behavior: Behavior normal. Behavior is cooperative.      GI:  Lab Results: Recent Labs    04/19/21 2059 04/20/21 0557  WBC 6.8 6.0  HGB 12.8* 11.9*  HCT 37.9* 34.5*  PLT 151 144*   BMET Recent Labs    04/19/21 2059 04/20/21 0614  NA 143 143  K 3.0* 3.1*  CL 107 114*  CO2 27 23  GLUCOSE 139* 119*  BUN 25* 23  CREATININE 1.76* 1.70*  CALCIUM 8.8* 8.1*   LFT Recent Labs    04/20/21 0614  PROT 6.3*  ALBUMIN 3.2*  AST 491*  ALT 483*  ALKPHOS 615*  BILITOT 7.0*   PT/INR No results for input(s): LABPROT, INR in the last 72 hours.   Studies/Results: CT ABDOMEN PELVIS WO CONTRAST  Result Date: 04/19/2021 CLINICAL DATA:  Abdominal abscess/infection suspected Flank pain with dark urine EXAM: CT ABDOMEN AND PELVIS WITHOUT CONTRAST TECHNIQUE: Multidetector CT imaging of the abdomen and pelvis was performed following the standard protocol without IV contrast. COMPARISON:  None. FINDINGS: Lower chest: Peripheral honeycombing versus emphysema in the right lower lobe. Mild bilateral lower lobe bronchiectasis. No acute airspace disease. Normal heart size  with coronary artery calcifications. Hepatobiliary: No focal liver abnormality on this noncontrast exam. Diffuse high-density contents of the gallbladder, may be stones/sludge. No pericholecystic fat stranding or inflammation. There is proximal common bile duct dilatation measuring up to 17 mm with truncation distally. No visualized choledocholithiasis. Pancreas: Parenchymal atrophy. There is rounded soft tissue fullness in the pancreatic head spanning 2.9 cm. Mild adjacent fat stranding about the pancreatic head and uncinate process. Proximal pancreatic ductal dilatation at 5 mm. Spleen: Normal in size without focal abnormality. Adrenals/Urinary Tract: Normal adrenal glands. Mild right hydroureteronephrosis secondary to a 4 mm obstructing stone in the right mid ureter, series 2, image 50. Minimal right perinephric edema. More distal ureters decompressed. No definite additional intrarenal calculi or focal intrarenal lesion. Cystic changes at the left renal hilum likely represent cortical and parapelvic cysts. This includes an exophytic cyst arising from the lower kidney measuring 9.6 cm. Left intrarenal calculi, largest in the mid kidney measures 8 mm. No ureteral stone. Partially distended urinary bladder without bladder stone or wall thickening. Stomach/Bowel: Small hiatal hernia. Ingested material distends the stomach. No small bowel obstruction or inflammation. Normal appendix. Diverticulosis of the descending and sigmoid colon, prominent in the sigmoid. No diverticulitis. Vascular/Lymphatic: Moderate aortic atherosclerosis without aneurysm. There may be a small peripancreatic nodes, not well assessed on this noncontrast exam. Reproductive: Prominent prostate gland spans 4.8 cm and causes mild mass effect on the bladder base. Other: No ascites or free air. Tiny fat containing umbilical hernia. 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 No acute osseous abnormality or evidence of focal bone lesion. IMPRESSION: 1. Mild right hydroureteronephrosis secondary to a 4 mm obstructing stone in the right mid ureter. 2. Rounded soft tissue fullness in the pancreatic head spanning 2.9 cm with mild adjacent fat stranding about the pancreatic head and uncinate process. Recommend further evaluation with pancreatic protocol MRI after resolution of acute event. There is proximal pancreatic as well as common bile duct dilatation. There may be a small peripancreatic nodes, not well assessed on this noncontrast exam. 3. High-density contents of the gallbladder, may be stones/sludge. No pericholecystic fat stranding or inflammation. 4. Left intrarenal calculi. Left renal cortical and parapelvic cysts. 5.  Colonic diverticulosis without diverticulitis. 6. Enlarged prostate gland causing mild mass effect on the bladder base. 7. 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 Aortic Atherosclerosis (ICD10-I70.0). Electronically Signed   By: Keith Rake M.D.   On: 04/19/2021 20:51   DG Chest 2 View  Result Date: 04/20/2021 CLINICAL DATA:  Pancreatic mass EXAM: CHEST - 2 VIEW COMPARISON:  None. FINDINGS: The lungs are symmetrically expanded. A 9 mm indeterminate nodule is seen within the a right apex. The lungs are otherwise clear. No pneumothorax or pleural effusion. Cardiac size within normal limits. Pulmonary vascularity is normal. Osseous structures are age-appropriate. No acute bone abnormality. IMPRESSION: 9 mm indeterminate right upper lobe pulmonary nodule. Dedicated CT imaging is recommended for further evaluation if no prior examinations are available to establish chronicity. 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 There is proximal pancreatic as well as common bile duct dilatation.  -T bili 7.0/AST 491/ALT 483/ALP 615 -No leukocytosis -CA 19-9 pending  Nephrolithiasis  History of Barrett's esophagus, s/p ablation, on daily PPI  History of H pylori gastritis  Glaucoma with resultant blindness  Plan: ERCP tomorrow with stent placement.  We thoroughly discussed procedure with patient to include nature, alternatives, benefits, and risks (including but not limited to post ERCP pancreatitis, bleeding, infection, perforation, anesthesia/cardiac and pulmonary complications).  Patient verbalized understanding and gave verbal consent to proceed with ERCP.  Unfortunately, we do not have EUS availability this week.  However, EUS with FNA can be arranged as an outpatient (or next week if patient remains hospitalized).  Continue to trend LFTs.  Clear liquid diet OK from GI standpoint (if OK per primary team/urology). NPO at midnight.  [facility] will follow.   LOS: 1 day   Salley Slaughter  PA-C 04/20/2021, 9:37 AM  Contact #  [phone]

## 2021-04-20 NOTE — Progress Notes (Addendum)
PROGRESS NOTE    Maurice Schneider  OFB:[id number] DOB: Dec 26, 1940 DOA: 04/19/2021 [facility]CP[facility]   Brief Narrative:  This 80 years old male with PMH significant for glaucoma, HTN, HLD, Barrett's esophagus, kidney stones, blindness, CAD presents in the ED with few days history of dark urine and flank pain.  Patient also reports having epigastric pain for about a week, denies any fever chills.He also reports last losing 5 pounds.  Recently moved from [place]. CT abdomen/ pelvis shows mild right hydroureteronephrosis secondary to 4 mm obstructing stone in the right mid ureter.  There is a round soft tissue fullness in the pancreatic head suspicious for pancreatic mass.  GI was consulted,  patient is  scheduled to have ERCP tomorrow with a stent placement by GI.  Patient was seen by urologist recommended medical expulsive therapy with hydration Flomax urine straining.  No acute urological intervention needed.  Assessment & Plan:   Active Problems:   Hydronephrosis with renal and ureteral calculus obstruction   Pancreatic mass   Hypokalemia   AKI (acute kidney injury) (HCC)   Elevated LFTs   Essential hypertension   Hyperlipidemia   Pulmonary nodule   Right Flank pain sec.to  Hydronephrosis with renal and ureteral calculus obstruction: Discussed with urology,  recommended medical expulsive therapy with IV hydration, Flomax,  urine straining. Adequate Pain control.  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 GI consulted.  MRCP is pending Patient is  scheduled to have ERCP with stent placement tomorrow to prevent cholangitis. Patient needs tissue diagnosis,  will be scheduled outpatient since no endoscopic ultrasound available next few days.  Hypokalemia : Replaced.  Continue to monitor.  AKI : It is unclear if CKD versus acute change.  Ordered urine electrolytes. Gentle hydration.  Recheck labs in the morning.  Elevated LFTs :   most likely secondary to obstruction.  GI recommended ERCP with a stent placement tomorrow.  Essential hypertension: Resume Coreg and Imdur.  Hold of ARB given possible AKI.  Hyperlipidemia :  hold statins since patient has elevated liver enzymes.  Pulmonary nodule will need further imaging if renal function improves after fluid rehydration and passing kidney stone.  considering CT with contrast      DVT prophylaxis: SCDs Code Status: Full code. Family Communication:  No family at bed side. Disposition Plan:  Status is: Inpatient  Remains inpatient appropriate because:Inpatient level of care appropriate due to severity of illness   Dispo: The patient is from: Home              Anticipated d/c is to: Home              Patient currently is not medically stable to d/c.   Difficult to place patient No  Consultants:   Gastroenterology  Procedures: Scheduled ERCP with a stent tomorrow  Antimicrobials:   Anti-infectives (From admission, onward)   None      Subjective: Patient was seen and examined at bedside.  Overnight events noted.  Patient reports feeling better,  appears yellow from appearance.  Objective: Vitals:   04/20/21 1045 04/20/21 1129 04/20/21 1140 04/20/21 1235  BP: 125/68  131/77 (!) 157/73  Pulse: (!) 56 62 64 (!) 55  Resp: 13  18 18   Temp:  98.2 F (36.8 C) 98.1 F (36.7 C) 98.2 F (36.8 C)  TempSrc:  Oral Oral Oral  SpO2: 96%  97% 95%  Weight:    78.7 kg  Height:    5' 6.5" (1.689  m)    Intake/Output Summary (Last 24 hours) at 04/20/2021 1631 Last data filed at 04/20/2021 1500 Gross per 24 hour  Intake 800 ml  Output 350 ml  Net 450 ml   Filed Weights   04/19/21 1929 04/20/21 1235  Weight: 77.1 kg 78.7 kg    Examination:  General exam: Appears calm and comfortable , appears yellow , not in distress. Respiratory system: Clear to auscultation. Respiratory effort normal. Cardiovascular system: S1 & S2 heard, RRR. No JVD, murmurs,  rubs, gallops or clicks. No pedal edema. Gastrointestinal system: Abdomen is nondistended, soft and  Mildly tender. No organomegaly or masses felt. Normal bowel sounds heard. Central nervous system: Alert and oriented. No focal neurological deficits. Extremities: Symmetric 5 x 5 power.  No edema, no cyanosis, no clubbing. Skin: No rashes, lesions or ulcers Psychiatry: Judgement and insight appear normal. Mood & affect appropriate.     Data Reviewed: I have personally reviewed following labs and imaging studies  CBC: Recent Labs  Lab 04/19/21 2059 04/20/21 0557  WBC 6.8 6.0  NEUTROABS 4.9 4.0  HGB 12.8* 11.9*  HCT 37.9* 34.5*  MCV 93.1 92.7  PLT 151 619*   Basic Metabolic Panel: Recent Labs  Lab 04/19/21 2059 04/20/21 0614  NA 143 143  K 3.0* 3.1*  CL 107 114*  CO2 27 23  GLUCOSE 139* 119*  BUN 25* 23  CREATININE 1.76* 1.70*  CALCIUM 8.8* 8.1*  MG  --  1.8  1.9  PHOS  --  2.5   GFR: Estimated Creatinine Clearance: 35.1 mL/min (A) (by C-G formula based on SCr of 1.7 mg/dL (H)). Liver Function Tests: Recent Labs  Lab 04/19/21 2059 04/20/21 0614  AST 554* 491*  ALT 549* 483*  ALKPHOS 647* 615*  BILITOT 8.6* 7.0*  PROT 7.4 6.3*  ALBUMIN 3.8 3.2*   Recent Labs  Lab 04/20/21 0614  LIPASE 44   No results for input(s): AMMONIA in the last 168 hours. Coagulation Profile: No results for input(s): INR, PROTIME in the last 168 hours. Cardiac Enzymes: Recent Labs  Lab 04/20/21 0614  CKTOTAL 208   BNP (last 3 results) No results for input(s): PROBNP in the last 8760 hours. HbA1C: No results for input(s): HGBA1C in the last 72 hours. CBG: No results for input(s): GLUCAP in the last 168 hours. Lipid Profile: No results for input(s): CHOL, HDL, LDLCALC, TRIG, CHOLHDL, LDLDIRECT in the last 72 hours. Thyroid Function Tests: Recent Labs    04/20/21 0614  TSH 2.696   Anemia Panel: No results for input(s): VITAMINB12, FOLATE, FERRITIN, TIBC, IRON,  RETICCTPCT in the last 72 hours. Sepsis Labs: No results for input(s): PROCALCITON, LATICACIDVEN in the last 168 hours.  Recent Results (from the past 240 hour(s))  SARS CORONAVIRUS 2 (TAT 6-24 HRS) Nasopharyngeal Nasopharyngeal Swab     Status: None   Collection Time: 04/20/21 12:48 AM   Specimen: Nasopharyngeal Swab  Result Value Ref Range Status   SARS Coronavirus 2 NEGATIVE NEGATIVE Final    Comment: (NOTE) SARS-CoV-2 target nucleic acids are NOT DETECTED.  The SARS-CoV-2 RNA is generally detectable in upper and lower respiratory specimens during the acute phase of infection. Negative results do not preclude SARS-CoV-2 infection, do not rule out co-infections with other pathogens, and should not be used as the sole basis for treatment or other patient management decisions. Negative results must be combined with clinical observations, patient history, and epidemiological information. The expected result is Negative.  Fact Sheet for Patients: SugarRoll.be  Fact  Sheet for Healthcare Providers: https://www.woods-mathews.com/  This test is not yet approved or cleared by the [place] FDA and  has been authorized for detection and/or diagnosis of SARS-CoV-2 by FDA under an Emergency Use Authorization (EUA). This EUA will remain  in effect (meaning this test can be used) for the duration of the COVID-19 declaration under Se ction 564(b)(1) of the Act, 21 U.S.C. section 360bbb-3(b)(1), unless the authorization is terminated or revoked sooner.  Performed at [facility], [place] [address] [place], [place] [address]          Radiology Studies: CT ABDOMEN PELVIS WO CONTRAST  Result Date: 04/19/2021 CLINICAL DATA:  Abdominal abscess/infection suspected Flank pain with dark urine EXAM: CT ABDOMEN AND PELVIS WITHOUT CONTRAST TECHNIQUE: Multidetector CT imaging of the abdomen and pelvis was performed following the standard protocol  without IV contrast. COMPARISON:  None. FINDINGS: Lower chest: Peripheral honeycombing versus emphysema in the right lower lobe. Mild bilateral lower lobe bronchiectasis. No acute airspace disease. Normal heart size with coronary artery calcifications. Hepatobiliary: No focal liver abnormality on this noncontrast exam. Diffuse high-density contents of the gallbladder, may be stones/sludge. No pericholecystic fat stranding or inflammation. There is proximal common bile duct dilatation measuring up to 17 mm with truncation distally. No visualized choledocholithiasis. Pancreas: Parenchymal atrophy. There is rounded soft tissue fullness in the pancreatic head spanning 2.9 cm. Mild adjacent fat stranding about the pancreatic head and uncinate process. Proximal pancreatic ductal dilatation at 5 mm. Spleen: Normal in size without focal abnormality. Adrenals/Urinary Tract: Normal adrenal glands. Mild right hydroureteronephrosis secondary to a 4 mm obstructing stone in the right mid ureter, series 2, image 50. Minimal right perinephric edema. More distal ureters decompressed. No definite additional intrarenal calculi or focal intrarenal lesion. Cystic changes at the left renal hilum likely represent cortical and parapelvic cysts. This includes an exophytic cyst arising from the lower kidney measuring 9.6 cm. Left intrarenal calculi, largest in the mid kidney measures 8 mm. No ureteral stone. Partially distended urinary bladder without bladder stone or wall thickening. Stomach/Bowel: Small hiatal hernia. Ingested material distends the stomach. No small bowel obstruction or inflammation. Normal appendix. Diverticulosis of the descending and sigmoid colon, prominent in the sigmoid. No diverticulitis. Vascular/Lymphatic: Moderate aortic atherosclerosis without aneurysm. There may be a small peripancreatic nodes, not well assessed on this noncontrast exam. Reproductive: Prominent prostate gland spans 4.8 cm and causes mild mass  effect on the bladder base. Other: No ascites or free air. Tiny fat containing umbilical hernia. 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 No acute osseous abnormality or evidence of focal bone lesion. IMPRESSION: 1. Mild right hydroureteronephrosis secondary to a 4 mm obstructing stone in the right mid ureter. 2. Rounded soft tissue fullness in the pancreatic head spanning 2.9 cm with mild adjacent fat stranding about the pancreatic head and uncinate process. Recommend further evaluation with pancreatic protocol MRI after resolution of acute event. There is proximal pancreatic as well as common bile duct dilatation. There may be a small peripancreatic nodes, not well assessed on this noncontrast exam. 3. High-density contents of the gallbladder, may be stones/sludge. No pericholecystic fat stranding or inflammation. 4. Left intrarenal calculi. Left renal cortical and parapelvic cysts. 5. Colonic diverticulosis without diverticulitis. 6. Enlarged prostate gland causing mild mass effect on the bladder base. 7. 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 Aortic Atherosclerosis (ICD10-I70.0). Electronically Signed  By: Keith Rake M.D.   On: 04/19/2021 20:51   DG Chest 2 View  Result Date: 04/20/2021 CLINICAL DATA:  Pancreatic mass EXAM: CHEST - 2 VIEW COMPARISON:  None. FINDINGS: The lungs are symmetrically expanded. A 9 mm indeterminate nodule is seen within the a right apex. The lungs are otherwise clear. No pneumothorax or pleural effusion. Cardiac size within normal limits. Pulmonary vascularity is normal. Osseous structures are age-appropriate. No acute bone abnormality. IMPRESSION: 9 mm indeterminate right upper lobe pulmonary nodule. Dedicated CT imaging is recommended for further evaluation if no prior examinations are available to establish  chronicity. Electronically Signed   By: Fidela Salisbury MD   On: 04/20/2021 00:21    Scheduled Meds: . dorzolamide-timolol  1 drop Left Eye BID  . isosorbide mononitrate  60 mg Oral Daily  . metoprolol tartrate  12.5 mg Oral BID  . pantoprazole  40 mg Oral Daily  . potassium chloride  40 mEq Oral Once  . prednisoLONE acetate  1 drop Both Eyes QID  . tamsulosin  0.4 mg Oral QPC supper   Continuous Infusions: . sodium chloride 100 mL/hr at 04/20/21 1316     LOS: 1 day    Time spent: 35 mins    Ellyse Rotolo, MD [facility]   If 7PM-7AM, please contact night-coverage

## 2021-04-20 NOTE — ED Notes (Signed)
Urine strained. No object observed in strainer

## 2021-04-20 NOTE — ED Notes (Signed)
2 RN attempted collection of 0500 labs. Phlebotomy contacted.

## 2021-04-20 NOTE — ED Notes (Signed)
ED TO INPATIENT HANDOFF REPORT  Name/Age/Gender Maurice Schneider 80 y.o. male  Code Status    Code Status Orders  (From admission, onward)         Start     Ordered   04/20/21 0048  Full code  Continuous        04/20/21 0047        Code Status History    This patient has a current code status but no historical code status.   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]  Level of Care/Admitting Diagnosis ED Disposition    ED Disposition Condition Comment   Admit  Hospital Area: [facility] [[id number]]  Level of Care: Telemetry [5]  Admit to tele based on following criteria: Other see comments  Comments: hypokalemia  May admit patient to [facility] or [facility] if equivalent level of care is available:: No  Covid Evaluation: Asymptomatic Screening Protocol (No Symptoms)  Diagnosis: Pancreatic mass [144315]  Admitting Physician: Toy Baker [[id number]]  Attending Physician: Toy Baker [[id number]]  Estimated length of stay: past midnight tomorrow  Certification:: I certify this patient will need inpatient services for at least 2 midnights       Medical History Past Medical History:  Diagnosis Date  . Blind   . CHF (congestive heart failure) ([place])   . Coronary artery disease   . Hypertension     Allergies Allergies  Allergen Reactions  . Penicillins Anaphylaxis    IV Location/Drains/Wounds Patient Lines/Drains/Airways Status    Active Line/Drains/Airways    Name Placement date Placement time Site Days   Peripheral IV 04/19/21 22 G Right Antecubital 04/19/21  2048  Antecubital  1          Labs/Imaging Results for orders placed or performed during the hospital encounter of 04/19/21 (from the past 48 hour(s))  CBC with Differential/Platelet     Status: Abnormal   Collection Time: 04/19/21  8:59 PM  Result Value Ref Range   WBC 6.8 4.0 - 10.5 K/uL   RBC 4.07 (L) 4.22  - 5.81 MIL/uL   Hemoglobin 12.8 (L) 13.0 - 17.0 g/dL   HCT 37.9 (L) 39.0 - 52.0 %   MCV 93.1 80.0 - 100.0 fL   MCH 31.4 26.0 - 34.0 pg   MCHC 33.8 30.0 - 36.0 g/dL   RDW 13.7 11.5 - 15.5 %   Platelets 151 150 - 400 K/uL   nRBC 0.0 0.0 - 0.2 %   Neutrophils Relative % 72 %   Neutro Abs 4.9 1.7 - 7.7 K/uL   Lymphocytes Relative 14 %   Lymphs Abs 0.9 0.7 - 4.0 K/uL   Monocytes Relative 12 %   Monocytes Absolute 0.8 0.1 - 1.0 K/uL   Eosinophils Relative 1 %   Eosinophils Absolute 0.1 0.0 - 0.5 K/uL   Basophils Relative 1 %   Basophils Absolute 0.0 0.0 - 0.1 K/uL   Immature Granulocytes 0 %   Abs Immature Granulocytes 0.02 0.00 - 0.07 K/uL    Comment: Performed at [facility], [place] [address], [place], [place] [address]  Comprehensive metabolic panel     Status: Abnormal   Collection Time: 04/19/21  8:59 PM  Result Value Ref Range   Sodium 143 135 - 145 mmol/L   Potassium 3.0 (L) 3.5 - 5.1 mmol/L   Chloride 107 98 - 111 mmol/L   CO2 27 22 - 32 mmol/L  Glucose, Bld 139 (H) 70 - 99 mg/dL    Comment: Glucose reference range applies only to samples taken after fasting for at least 8 hours.   BUN 25 (H) 8 - 23 mg/dL   Creatinine, Ser 1.76 (H) 0.61 - 1.24 mg/dL   Calcium 8.8 (L) 8.9 - 10.3 mg/dL   Total Protein 7.4 6.5 - 8.1 g/dL   Albumin 3.8 3.5 - 5.0 g/dL   AST 554 (H) 15 - 41 U/L   ALT 549 (H) 0 - 44 U/L   Alkaline Phosphatase 647 (H) 38 - 126 U/L   Total Bilirubin 8.6 (H) 0.3 - 1.2 mg/dL   GFR, Estimated 39 (L) >60 mL/min    Comment: (NOTE) Calculated using the CKD-EPI Creatinine Equation (2021)    Anion gap 9 5 - 15    Comment: Performed at [facility], [place] [address], [place], [place] [address]  Urinalysis, Routine w reflex microscopic Urine, Clean Catch     Status: Abnormal   Collection Time: 04/19/21  9:34 PM  Result Value Ref Range   Color, Urine AMBER (A) YELLOW    Comment: BIOCHEMICALS MAY BE AFFECTED BY COLOR   APPearance  HAZY (A) CLEAR   Specific Gravity, Urine 1.017 1.005 - 1.030   pH 5.0 5.0 - 8.0   Glucose, UA NEGATIVE NEGATIVE mg/dL   Hgb urine dipstick MODERATE (A) NEGATIVE   Bilirubin Urine SMALL (A) NEGATIVE   Ketones, ur 5 (A) NEGATIVE mg/dL   Protein, ur 30 (A) NEGATIVE mg/dL   Nitrite NEGATIVE NEGATIVE   Leukocytes,Ua NEGATIVE NEGATIVE   RBC / HPF >50 (H) 0 - 5 RBC/hpf   WBC, UA 6-10 0 - 5 WBC/hpf   Bacteria, UA RARE (A) NONE SEEN   Squamous Epithelial / LPF 0-5 0 - 5   Mucus PRESENT    Hyaline Casts, UA PRESENT     Comment: Performed at [facility], [place] [address], [place], [place] [address]  Sodium, urine, random     Status: None   Collection Time: 04/19/21 11:36 PM  Result Value Ref Range   Sodium, Ur 34 mmol/L    Comment: Performed at [facility], [place] [address], [place], [place] [address]  Osmolality, urine     Status: None   Collection Time: 04/19/21 11:36 PM  Result Value Ref Range   Osmolality, Ur 439 300 - 900 mOsm/kg    Comment: Performed at [place] [address] [place], [place] [address]  Creatinine, urine, random     Status: None   Collection Time: 04/19/21 11:36 PM  Result Value Ref Range   Creatinine, Urine 188.05 mg/dL    Comment: Performed at [facility], [place] [address], [place], [place] [address]   CT ABDOMEN PELVIS WO CONTRAST  Result Date: 04/19/2021 CLINICAL DATA:  Abdominal abscess/infection suspected Flank pain with dark urine EXAM: CT ABDOMEN AND PELVIS WITHOUT CONTRAST TECHNIQUE: Multidetector CT imaging of the abdomen and pelvis was performed following the standard protocol without IV contrast. COMPARISON:  None. FINDINGS: Lower chest: Peripheral honeycombing versus emphysema in the right lower lobe. Mild bilateral lower lobe bronchiectasis. No acute airspace disease. Normal heart size with coronary artery calcifications. Hepatobiliary: No focal liver abnormality on this noncontrast exam.  Diffuse high-density contents of the gallbladder, may be stones/sludge. No pericholecystic fat stranding or inflammation. There is proximal common bile duct dilatation measuring up to 17 mm with truncation distally. No visualized choledocholithiasis. Pancreas: Parenchymal atrophy.  There is rounded soft tissue fullness in the pancreatic head spanning 2.9 cm. Mild adjacent fat stranding about the pancreatic head and uncinate process. Proximal pancreatic ductal dilatation at 5 mm. Spleen: Normal in size without focal abnormality. Adrenals/Urinary Tract: Normal adrenal glands. Mild right hydroureteronephrosis secondary to a 4 mm obstructing stone in the right mid ureter, series 2, image 50. Minimal right perinephric edema. More distal ureters decompressed. No definite additional intrarenal calculi or focal intrarenal lesion. Cystic changes at the left renal hilum likely represent cortical and parapelvic cysts. This includes an exophytic cyst arising from the lower kidney measuring 9.6 cm. Left intrarenal calculi, largest in the mid kidney measures 8 mm. No ureteral stone. Partially distended urinary bladder without bladder stone or wall thickening. Stomach/Bowel: Small hiatal hernia. Ingested material distends the stomach. No small bowel obstruction or inflammation. Normal appendix. Diverticulosis of the descending and sigmoid colon, prominent in the sigmoid. No diverticulitis. Vascular/Lymphatic: Moderate aortic atherosclerosis without aneurysm. There may be a small peripancreatic nodes, not well assessed on this noncontrast exam. Reproductive: Prominent prostate gland spans 4.8 cm and causes mild mass effect on the bladder base. Other: No ascites or free air. Tiny fat containing umbilical hernia. 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 No acute osseous abnormality or evidence of focal bone lesion. IMPRESSION: 1. Mild right  hydroureteronephrosis secondary to a 4 mm obstructing stone in the right mid ureter. 2. Rounded soft tissue fullness in the pancreatic head spanning 2.9 cm with mild adjacent fat stranding about the pancreatic head and uncinate process. Recommend further evaluation with pancreatic protocol MRI after resolution of acute event. There is proximal pancreatic as well as common bile duct dilatation. There may be a small peripancreatic nodes, not well assessed on this noncontrast exam. 3. High-density contents of the gallbladder, may be stones/sludge. No pericholecystic fat stranding or inflammation. 4. Left intrarenal calculi. Left renal cortical and parapelvic cysts. 5. Colonic diverticulosis without diverticulitis. 6. Enlarged prostate gland causing mild mass effect on the bladder base. 7. 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 Aortic Atherosclerosis (ICD10-I70.0). Electronically Signed   By: Keith Rake M.D.   On: 04/19/2021 20:51   DG Chest 2 View  Result Date: 04/20/2021 CLINICAL DATA:  Pancreatic mass EXAM: CHEST - 2 VIEW COMPARISON:  None. FINDINGS: The lungs are symmetrically expanded. A 9 mm indeterminate nodule is seen within the a right apex. The lungs are otherwise clear. No pneumothorax or pleural effusion. Cardiac size within normal limits. Pulmonary vascularity is normal. Osseous structures are age-appropriate. No acute bone abnormality. IMPRESSION: 9 mm indeterminate right upper lobe pulmonary nodule. Dedicated CT imaging is recommended for further evaluation if no prior examinations are available to establish chronicity. Electronically Signed   By: Fidela Salisbury MD   On: 04/20/2021 00:21    Pending Labs Unresulted Labs (From admission, onward)          Start     Ordered   04/20/21 0557  CBC with Differential/Platelet  Once,   R        04/20/21 0557   04/20/21 0500  Magnesium  Tomorrow morning,   R        04/20/21  0047   04/20/21 0500  Phosphorus  Tomorrow morning,   R        04/20/21 0047   04/20/21 0500  CBC WITH DIFFERENTIAL  Tomorrow morning,   R  04/20/21 0047   04/20/21 0500  TSH  Tomorrow morning,   R        04/20/21 0047   04/20/21 0500  Comprehensive metabolic panel  Tomorrow morning,   R        04/20/21 0047   04/20/21 0500  Cancer antigen 19-9  Tomorrow morning,   R        04/20/21 0111   04/20/21 0112  Hepatitis panel, acute  Tomorrow morning,   R        04/20/21 0111   04/19/21 2333  Culture, Urine  Once,   STAT        04/19/21 2332   04/19/21 2330  Lipase, blood  Add-on,   AD        04/19/21 2329   04/19/21 2330  SARS CORONAVIRUS 2 (TAT 6-24 HRS) Nasopharyngeal Nasopharyngeal Swab  (Tier 3 - Symptomatic/asymptomatic)  Once,   STAT       Question Answer Comment  Is this test for diagnosis or screening Screening   Symptomatic for COVID-19 as defined by CDC No   Hospitalized for COVID-19 No   Admitted to ICU for COVID-19 No   Previously tested for COVID-19 No   Resident in a congregate (group) care setting No   Employed in healthcare setting No   Has patient completed COVID vaccination(s) (2 doses of Pfizer/Moderna 1 dose of [employer]) Unknown      04/19/21 2329   04/19/21 2329  Magnesium  Add-on,   AD        04/19/21 2329   04/19/21 2329  CK  Add-on,   AD        04/19/21 2329          Vitals/Pain Today's Vitals   04/20/21 0246 04/20/21 0445 04/20/21 0530 04/20/21 0600  BP: (!) 119/51 134/66 136/67 (!) 149/67  Pulse: (!) 57 (!) 51 61 (!) 54  Resp: 16   15  Temp:      TempSrc:      SpO2: 93% 93% 96% 96%  Weight:      Height:      PainSc: Asleep       Isolation Precautions No active isolations  Medications Medications  tamsulosin (FLOMAX) capsule 0.4 mg (has no administration in time range)  acetaminophen (TYLENOL) tablet 650 mg (650 mg Oral Given 04/20/21 0654)    Or  acetaminophen (TYLENOL) suppository 650 mg ( Rectal See Alternative 04/20/21  0654)  HYDROcodone-acetaminophen (NORCO/VICODIN) 5-325 MG per tablet 1-2 tablet (has no administration in time range)  0.9 %  sodium chloride infusion ( Intravenous New Bag/Given 04/20/21 0139)  fentaNYL (SUBLIMAZE) injection 12.5-50 mcg (has no administration in time range)  ondansetron (ZOFRAN) tablet 4 mg (has no administration in time range)    Or  ondansetron (ZOFRAN) injection 4 mg (has no administration in time range)  dorzolamide-timolol (COSOPT) 22.3-6.8 MG/ML ophthalmic solution 1 drop (has no administration in time range)  metoprolol tartrate (LOPRESSOR) tablet 12.5 mg (12.5 mg Oral Given 04/20/21 0132)  pantoprazole (PROTONIX) EC tablet 40 mg (has no administration in time range)  prednisoLONE acetate (PRED FORTE) 1 % ophthalmic suspension 1 drop (has no administration in time range)  isosorbide mononitrate (IMDUR) 24 hr tablet 60 mg (has no administration in time range)  sodium chloride 0.9 % bolus 500 mL (0 mLs Intravenous Stopped 04/19/21 2306)  potassium chloride 10 mEq in 100 mL IVPB (0 mEq Intravenous Stopped 04/20/21 0357)    Mobility walks with device

## 2021-04-20 NOTE — Plan of Care (Signed)
Briefly, patient is a 80yo M with history of HTN, HL, CAD, nephrolithiasis who presented with flank pain and dark urine found to have pancreatic mass and elevated LFTs as well as 70mm right ureteral stone with mild hydronephrosis. Cr elevated to 1.76. Minimal concern for UTI in the setting of no leukocytosis, UA without leukocytes, nitrites. Recommend trial of medical expulsive therapy with hydration, flomax, urine straining, pain control and nausea control as needed. If patient clinically worsens, has worsening pain or continued rising Cr, please contact urology as this may indicate need for ureteral stenting or more urgent definitive stone procedure.  [facility]

## 2021-04-21 ENCOUNTER — Inpatient Hospital Stay (HOSPITAL_COMMUNITY): Payer: Medicare Other

## 2021-04-21 ENCOUNTER — Encounter (HOSPITAL_COMMUNITY): Payer: Self-pay | Admitting: Internal Medicine

## 2021-04-21 ENCOUNTER — Inpatient Hospital Stay (HOSPITAL_COMMUNITY): Payer: Medicare Other | Admitting: Anesthesiology

## 2021-04-21 ENCOUNTER — Encounter (HOSPITAL_COMMUNITY)
Admission: EM | Disposition: A | Discharge status: Home / Self Care | Payer: Self-pay | Source: Home / Self Care | Attending: Family Medicine

## 2021-04-21 ENCOUNTER — Other Ambulatory Visit: Payer: Self-pay

## 2021-04-21 DIAGNOSIS — K8689 Other specified diseases of pancreas: Secondary | ICD-10-CM

## 2021-04-21 HISTORY — PX: BILIARY STENT PLACEMENT: SHX5538

## 2021-04-21 HISTORY — PX: BILIARY BRUSHING: SHX6843

## 2021-04-21 HISTORY — PX: SPHINCTEROTOMY: SHX5544

## 2021-04-21 HISTORY — PX: ERCP: SHX5425

## 2021-04-21 LAB — COMPREHENSIVE METABOLIC PANEL
ALT: 400 U/L — ABNORMAL HIGH (ref 0–44)
AST: 442 U/L — ABNORMAL HIGH (ref 15–41)
Albumin: 3 g/dL — ABNORMAL LOW (ref 3.5–5.0)
Alkaline Phosphatase: 616 U/L — ABNORMAL HIGH (ref 38–126)
Anion gap: 8 (ref 5–15)
BUN: 13 mg/dL (ref 8–23)
CO2: 23 mmol/L (ref 22–32)
Calcium: 8.5 mg/dL — ABNORMAL LOW (ref 8.9–10.3)
Chloride: 111 mmol/L (ref 98–111)
Creatinine, Ser: 1.24 mg/dL (ref 0.61–1.24)
GFR, Estimated: 59 mL/min — ABNORMAL LOW (ref >60)
Glucose, Bld: 89 mg/dL (ref 70–99)
Potassium: 2.9 mmol/L — ABNORMAL LOW (ref 3.5–5.1)
Sodium: 142 mmol/L (ref 135–145)
Total Bilirubin: 7 mg/dL — ABNORMAL HIGH (ref 0.3–1.2)
Total Protein: 6.1 g/dL — ABNORMAL LOW (ref 6.5–8.1)

## 2021-04-21 LAB — CBC
HCT: 35 % — ABNORMAL LOW (ref 39.0–52.0)
Hemoglobin: 11.8 g/dL — ABNORMAL LOW (ref 13.0–17.0)
MCH: 31.2 pg (ref 26.0–34.0)
MCHC: 33.7 g/dL (ref 30.0–36.0)
MCV: 92.6 fL (ref 80.0–100.0)
Platelets: 137 10*3/uL — ABNORMAL LOW (ref 150–400)
RBC: 3.78 MIL/uL — ABNORMAL LOW (ref 4.22–5.81)
RDW: 13.7 % (ref 11.5–15.5)
WBC: 5.6 10*3/uL (ref 4.0–10.5)
nRBC: 0 % (ref 0.0–0.2)

## 2021-04-21 LAB — CANCER ANTIGEN 19-9: CA 19-9: <2 U/mL (ref 0–35)

## 2021-04-21 LAB — URINE CULTURE: Culture: <10000 — AB

## 2021-04-21 LAB — MAGNESIUM: Magnesium: 1.7 mg/dL (ref 1.7–2.4)

## 2021-04-21 SURGERY — ERCP, WITH INTERVENTION IF INDICATED
Anesthesia: General

## 2021-04-21 MED ORDER — BUPROPION HCL 100 MG PO TABS
100.0000 mg | ORAL_TABLET | Freq: Every day | ORAL | Status: DC
  Administered 2021-04-21 – 2021-04-22 (×2): 100 mg via ORAL
  Filled 2021-04-21 (×2): qty 1

## 2021-04-21 MED ORDER — DEXAMETHASONE SODIUM PHOSPHATE 10 MG/ML IJ SOLN
INTRAMUSCULAR | Status: DC | PRN
  Administered 2021-04-21: 10 mg via INTRAVENOUS

## 2021-04-21 MED ORDER — CIPROFLOXACIN IN D5W 400 MG/200ML IV SOLN
400.0000 mg | Freq: Once | INTRAVENOUS | Status: AC
  Administered 2021-04-21: 400 mg via INTRAVENOUS

## 2021-04-21 MED ORDER — ACETAMINOPHEN 325 MG PO TABS
650.0000 mg | ORAL_TABLET | Freq: Two times a day (BID) | ORAL | Status: DC
  Administered 2021-04-21 – 2021-04-22 (×2): 650 mg via ORAL
  Filled 2021-04-21 (×2): qty 2

## 2021-04-21 MED ORDER — SODIUM CHLORIDE 0.9 % IV SOLN
INTRAVENOUS | Status: DC | PRN
  Administered 2021-04-21: 25 mL

## 2021-04-21 MED ORDER — INDOMETHACIN 50 MG RE SUPP
RECTAL | Status: AC
  Filled 2021-04-21: qty 2

## 2021-04-21 MED ORDER — ACETAMINOPHEN ER 650 MG PO TBCR: 650.0000 mg | EXTENDED_RELEASE_TABLET | Freq: Two times a day (BID) | ORAL | Status: DC

## 2021-04-21 MED ORDER — METOPROLOL SUCCINATE ER 25 MG PO TB24
12.5000 mg | ORAL_TABLET | Freq: Every day | ORAL | Status: DC
  Administered 2021-04-22: 12.5 mg via ORAL
  Filled 2021-04-21: qty 1

## 2021-04-21 MED ORDER — ASPIRIN EC 81 MG PO TBEC
81.0000 mg | DELAYED_RELEASE_TABLET | Freq: Every day | ORAL | Status: DC
  Administered 2021-04-21 – 2021-04-22 (×2): 81 mg via ORAL
  Filled 2021-04-21 (×2): qty 1

## 2021-04-21 MED ORDER — INDOMETHACIN 50 MG RE SUPP
RECTAL | Status: DC | PRN
  Administered 2021-04-21: 100 mg via RECTAL

## 2021-04-21 MED ORDER — LIDOCAINE 2% (20 MG/ML) 5 ML SYRINGE
INTRAMUSCULAR | Status: DC | PRN
  Administered 2021-04-21: 100 mg via INTRAVENOUS

## 2021-04-21 MED ORDER — ATORVASTATIN CALCIUM 40 MG PO TABS
40.0000 mg | ORAL_TABLET | Freq: Every day | ORAL | Status: DC
  Administered 2021-04-21 – 2021-04-22 (×2): 40 mg via ORAL
  Filled 2021-04-21 (×2): qty 1

## 2021-04-21 MED ORDER — LOSARTAN POTASSIUM 50 MG PO TABS
50.0000 mg | ORAL_TABLET | Freq: Every day | ORAL | Status: DC
  Administered 2021-04-21 – 2021-04-22 (×2): 50 mg via ORAL
  Filled 2021-04-21 (×2): qty 1

## 2021-04-21 MED ORDER — GLUCAGON HCL RDNA (DIAGNOSTIC) 1 MG IJ SOLR
INTRAMUSCULAR | Status: AC
  Filled 2021-04-21: qty 1

## 2021-04-21 MED ORDER — SODIUM CHLORIDE 0.9 % IV SOLN: INTRAVENOUS | Status: DC

## 2021-04-21 MED ORDER — ASPIRIN 81 MG PO TBEC: 81.0000 mg | DELAYED_RELEASE_TABLET | Freq: Every day | ORAL | Status: DC

## 2021-04-21 MED ORDER — FENTANYL CITRATE (PF) 100 MCG/2ML IJ SOLN
INTRAMUSCULAR | Status: DC | PRN
  Administered 2021-04-21: 100 ug via INTRAVENOUS

## 2021-04-21 MED ORDER — POTASSIUM CHLORIDE 10 MEQ/100ML IV SOLN
10.0000 meq | Freq: Once | INTRAVENOUS | Status: AC
  Administered 2021-04-21: 10 meq via INTRAVENOUS
  Filled 2021-04-21: qty 100

## 2021-04-21 MED ORDER — POTASSIUM CHLORIDE 10 MEQ/100ML IV SOLN
10.0000 meq | INTRAVENOUS | Status: AC
Start: 2021-04-21 — End: 2021-04-21
  Administered 2021-04-21 (×3): 10 meq via INTRAVENOUS
  Filled 2021-04-21 (×3): qty 100

## 2021-04-21 MED ORDER — FENTANYL CITRATE (PF) 100 MCG/2ML IJ SOLN
INTRAMUSCULAR | Status: AC
  Filled 2021-04-21: qty 2

## 2021-04-21 MED ORDER — CIPROFLOXACIN IN D5W 400 MG/200ML IV SOLN
INTRAVENOUS | Status: AC
  Filled 2021-04-21: qty 200

## 2021-04-21 MED ORDER — LACTATED RINGERS IV SOLN: INTRAVENOUS | Status: DC

## 2021-04-21 MED ORDER — SUGAMMADEX SODIUM 200 MG/2ML IV SOLN
INTRAVENOUS | Status: DC | PRN
  Administered 2021-04-21: 300 mg via INTRAVENOUS

## 2021-04-21 MED ORDER — PROPOFOL 10 MG/ML IV BOLUS
INTRAVENOUS | Status: DC | PRN
  Administered 2021-04-21: 140 mg via INTRAVENOUS

## 2021-04-21 MED ORDER — ROCURONIUM BROMIDE 10 MG/ML (PF) SYRINGE
PREFILLED_SYRINGE | INTRAVENOUS | Status: DC | PRN
  Administered 2021-04-21: 50 mg via INTRAVENOUS

## 2021-04-21 MED ORDER — LOSARTAN POTASSIUM 50 MG PO TABS: 50.0000 mg | ORAL_TABLET | Freq: Every day | ORAL | Status: DC

## 2021-04-21 MED ORDER — POTASSIUM CHLORIDE 20 MEQ PO PACK
40.0000 meq | PACK | Freq: Once | ORAL | Status: AC
  Administered 2021-04-21: 40 meq via ORAL
  Filled 2021-04-21: qty 2

## 2021-04-21 NOTE — Progress Notes (Signed)
Initial Nutrition Assessment  INTERVENTION:   Once diet advanced: -Ensure Enlive po BID, each supplement provides 350 kcal and 20 grams of protein  NUTRITION DIAGNOSIS:   Increased nutrient needs related to acute illness as evidenced by estimated needs.  GOAL:   Patient will meet greater than or equal to 90% of their needs  MONITOR:   PO intake,Supplement acceptance,Diet advancement,Labs,Weight trends,I & O's  REASON FOR ASSESSMENT:   Malnutrition Screening Tool    ASSESSMENT:   80 years old male with PMH significant for glaucoma, HTN, HLD, Barrett's esophagus, kidney stones, blindness, CAD presents in the ED with few days history of dark urine and flank pain.  Patient also reports having epigastric pain for about a week, denies any fever chills.He also reports last losing 5 pounds. Admitted for hydronephrosis with renal and ureteral calculus obstruction and new pancreatic mass,  Patient currently in endoscopy for ERCP and stent placement. Currently NPO. Per chart review, pt has not had any PO intake since admission. Reported poor appetite.  Per weight records, no weights available PTA.  Medications: Lactated ringers  Labs reviewed: Low K  NUTRITION - FOCUSED PHYSICAL EXAM:  In endo  Diet Order:   Diet Order            Diet NPO time specified  Diet effective midnight                 EDUCATION NEEDS:   No education needs have been identified at this time  Skin:  Skin Assessment: Reviewed RN Assessment  Last BM:  5/26  Height:   Ht Readings from Last 1 Encounters:  04/20/21 5' 6.5" (1.689 m)    Weight:   Wt Readings from Last 1 Encounters:  04/20/21 78.7 kg   BMI:  Body mass index is 27.58 kg/m.  Estimated Nutritional Needs:   Kcal:  1950-2150  Protein:  90-100g  Fluid:  2L/day  Clayton Bibles, MS, RD, LDN Inpatient Clinical Dietitian Contact information available via Amion

## 2021-04-21 NOTE — Care Management Important Message (Signed)
Important Message  Patient Details IM Letter given to the Patient. Name: Maurice Schneider MRN: [id number] Date of Birth: May 11, 1941   Medicare Important Message Given:  Yes     Kerin Salen 04/21/2021, 10:23 AM

## 2021-04-21 NOTE — Progress Notes (Addendum)
Pt is NPO but requesting AM "heart" medications. GI PA Baron-Johnson notified. She gave permission via secure chat for pt to have AM metoprolol and Imdur X 1 and continue with NPO diet for procedure today. However, HR Is 52 and as per MD Dwyane Dee will hold Metoprolol and give Imdur.

## 2021-04-21 NOTE — Transfer of Care (Signed)
Immediate Anesthesia Transfer of Care Note  Patient: Maurice Schneider  Procedure(s) Performed: ENDOSCOPIC RETROGRADE CHOLANGIOPANCREATOGRAPHY (ERCP) (N/A ) SPHINCTEROTOMY  Patient Location: PACU  Anesthesia Type:General  Level of Consciousness: awake, oriented, drowsy and patient cooperative  Airway & Oxygen Therapy: Patient Spontanous Breathing and Patient connected to face mask oxygen  Post-op Assessment: Report given to RN and Post -op Vital signs reviewed and stable  Post vital signs: Reviewed and stable  Last Vitals:  Vitals Value Taken Time  BP 149/61 04/21/21 1412  Temp    Pulse 58 04/21/21 1413  Resp 16 04/21/21 1413  SpO2 100 % 04/21/21 1413  Vitals shown include unvalidated device data.  Last Pain:  Vitals:   04/21/21 1412  TempSrc:   PainSc: 0-No pain      Patients Stated Pain Goal: 1 (46/96/29 5284)  Complications: No complications documented.

## 2021-04-21 NOTE — Anesthesia Preprocedure Evaluation (Addendum)
Anesthesia Evaluation  Patient identified by MRN, date of birth, ID band Patient awake    Reviewed: Allergy & Precautions, NPO status , Patient's Chart, lab work & pertinent test results, reviewed documented beta blocker date and time   History of Anesthesia Complications Negative for: history of anesthetic complications  Airway Mallampati: II  TM Distance: >3 FB Neck ROM: Full    Dental  (+) Edentulous Upper, Missing,    Pulmonary Patient abstained from smoking., former smoker,    Pulmonary exam normal        Cardiovascular hypertension, Pt. on medications and Pt. on home beta blockers + CAD and +CHF  Normal cardiovascular exam     Neuro/Psych negative neurological ROS  negative psych ROS   GI/Hepatic Neg liver ROS, GERD  Controlled and Medicated,Pancreatic mass   Endo/Other  negative endocrine ROS  Renal/GU   negative genitourinary   Musculoskeletal negative musculoskeletal ROS (+)   Abdominal   Peds  Hematology  (+) anemia ,   Anesthesia Other Findings Day of surgery medications reviewed with patient.  Reproductive/Obstetrics negative OB ROS                            Anesthesia Physical Anesthesia Plan  ASA: III  Anesthesia Plan: General   Post-op Pain Management:    Induction: Intravenous  PONV Risk Score and Plan: 2 and Treatment may vary due to age or medical condition, Ondansetron and Dexamethasone  Airway Management Planned: Oral ETT  Additional Equipment: None  Intra-op Plan:   Post-operative Plan: Extubation in OR  Informed Consent: I have reviewed the patients History and Physical, chart, labs and discussed the procedure including the risks, benefits and alternatives for the proposed anesthesia with the patient or authorized representative who has indicated his/her understanding and acceptance.     Dental advisory given  Plan Discussed with:  CRNA  Anesthesia Plan Comments:        Anesthesia Quick Evaluation

## 2021-04-21 NOTE — Anesthesia Procedure Notes (Signed)
Procedure Name: Intubation Date/Time: 04/21/2021 1:22 PM Performed by: Sharlette Dense, CRNA Patient Re-evaluated:Patient Re-evaluated prior to induction Oxygen Delivery Method: Circle system utilized Preoxygenation: Pre-oxygenation with 100% oxygen Induction Type: IV induction Ventilation: Mask ventilation without difficulty Laryngoscope Size: Miller and 3 Grade View: Grade I Tube type: Oral Tube size: 7.5 mm Number of attempts: 1 Airway Equipment and Method: Stylet Placement Confirmation: ETT inserted through vocal cords under direct vision,  positive ETCO2 and breath sounds checked- equal and bilateral Secured at: 22 cm Tube secured with: Tape Dental Injury: Teeth and Oropharynx as per pre-operative assessment

## 2021-04-21 NOTE — Progress Notes (Signed)
PROGRESS NOTE    Maurice Schneider  VFI:[id number] DOB: 1941-04-06 DOA: 04/19/2021 [facility]   Brief Narrative:  This 80 years old male with PMH significant for glaucoma, HTN, HLD, Barrett's esophagus, kidney stones, blindness, CAD presents in the ED with few days history of dark urine and flank pain.  Patient also reports having epigastric pain for about a week, denies any fever chills. He also reports last losing 5 pounds.  Recently moved from [place]. CT abdomen/ pelvis shows mild right hydroureteronephrosis secondary to 4 mm obstructing stone in the right mid ureter.  There is a round soft tissue fullness in the pancreatic head suspicious for pancreatic mass.  GI was consulted,  He underwent ERCP with stent placement . Tolerated well.  Patient was seen by urologist recommended medical expulsive therapy with hydration,  Flomax,  urine straining.  No acute urological intervention needed.  General surgery consulted for localized pancreatic mass.  Assessment & Plan:   Active Problems:   Hydronephrosis with renal and ureteral calculus obstruction   Pancreatic mass   Hypokalemia   AKI (acute kidney injury) (HCC)   Elevated LFTs   Essential hypertension   Hyperlipidemia   Pulmonary nodule   Right Flank pain sec.to  Hydronephrosis with renal and ureteral calculus obstruction: Discussed with urology,  recommended medical expulsive therapy with IV hydration, Flomax,  urine straining. Adequate Pain control.   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 GI consulted.  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 Patient underwent ERCP with stent placement today to prevent cholangitis. Patient needs tissue diagnosis,  will be scheduled outpatient since no endoscopic ultrasound available next few days. General surgery consulted for localized pancreatic mass.  Hypokalemia : Replaced.  Continue to  monitor. Recheck labs in the morning.  AKI : >  Improved. It is unclear if CKD versus acute change.  Ordered urine electrolytes. Gentle hydration.  Recheck labs in the morning.  Elevated LFTs :  most likely secondary to obstruction.  GI patient underwent ERCP with a stent placement. LFTs are trending down.  Continue to monitor.  Essential hypertension: Resume Coreg and Imdur.  Hold of ARB given possible AKI.  Hyperlipidemia : hold statins since patient has elevated liver enzymes.  Pulmonary nodule will need further imaging if renal function improves after fluid rehydration and passing kidney stone.  considering CT with contrast .   DVT prophylaxis: SCDs Code Status: Full code. Family Communication:  No family at bed side. Disposition Plan:  Status is: Inpatient  Remains inpatient appropriate because:Inpatient level of care appropriate due to severity of illness   Dispo: The patient is from: Home              Anticipated d/c is to: Home              Patient currently is not medically stable to d/c.   Difficult to place patient No  Consultants:   Gastroenterology  Procedures:  ERCP with a stent.  Antimicrobials:   Anti-infectives (From admission, onward)   Start     Dose/Rate Route Frequency Ordered Stop   04/21/21 1400  ciprofloxacin (CIPRO) IVPB 400 mg        400 mg 200 mL/hr over 60 Minutes Intravenous  Once 04/21/21 0513 04/21/21 1410      Subjective: Patient was seen and examined at bedside.  Overnight events noted.   Patient reports feeling better, anticipating ERCP today.  He reports abdominal pain is better.  Denies nausea  and vomiting.  Objective: Vitals:   04/21/21 1412 04/21/21 1420 04/21/21 1430 04/21/21 1441  BP: (!) 149/61 (!) 146/59 131/60 132/61  Pulse:  64 (!) 54 (!) 50  Resp: 12 14 10 14   Temp: 98.3 F (36.8 C)     TempSrc: Oral     SpO2: 97% 91% 95% 94%  Weight:      Height:        Intake/Output Summary (Last 24 hours) at  04/21/2021 1558 Last data filed at 04/21/2021 1514 Gross per 24 hour  Intake 2325.41 ml  Output 1052 ml  Net 1273.41 ml   Filed Weights   04/19/21 1929 04/20/21 1235  Weight: 77.1 kg 78.7 kg    Examination:  General exam: Appears calm and comfortable , appears yellow , not in distress. Respiratory system: Clear to auscultation. Respiratory effort normal. Cardiovascular system: S1 & S2 heard, RRR. No JVD, murmurs, rubs, gallops or clicks. No pedal edema. Gastrointestinal system: Abdomen is nondistended, soft and  Mildly tender+. No organomegaly or masses felt. Normal bowel sounds heard. Central nervous system: Alert and oriented. No focal neurological deficits. Extremities: Symmetric 5 x 5 power.  No edema, no cyanosis, no clubbing. Skin: No rashes, lesions or ulcers Psychiatry: Judgement and insight appear normal. Mood & affect appropriate.     Data Reviewed: I have personally reviewed following labs and imaging studies  CBC: Recent Labs  Lab 04/19/21 2059 04/20/21 0557 04/21/21 0532  WBC 6.8 6.0 5.6  NEUTROABS 4.9 4.0  --   HGB 12.8* 11.9* 11.8*  HCT 37.9* 34.5* 35.0*  MCV 93.1 92.7 92.6  PLT 151 144* 025*   Basic Metabolic Panel: Recent Labs  Lab 04/19/21 2059 04/20/21 0614 04/21/21 0532 04/21/21 0640  NA 143 143 142  --   K 3.0* 3.1* 2.9*  --   CL 107 114* 111  --   CO2 27 23 23   --   GLUCOSE 139* 119* 89  --   BUN 25* 23 13  --   CREATININE 1.76* 1.70* 1.24  --   CALCIUM 8.8* 8.1* 8.5*  --   MG  --  1.8  1.9  --  1.7  PHOS  --  2.5  --   --    GFR: Estimated Creatinine Clearance: 48.2 mL/min (by C-G formula based on SCr of 1.24 mg/dL). Liver Function Tests: Recent Labs  Lab 04/19/21 2059 04/20/21 0614 04/21/21 0532  AST 554* 491* 442*  ALT 549* 483* 400*  ALKPHOS 647* 615* 616*  BILITOT 8.6* 7.0* 7.0*  PROT 7.4 6.3* 6.1*  ALBUMIN 3.8 3.2* 3.0*   Recent Labs  Lab 04/20/21 0614  LIPASE 44   No results for input(s): AMMONIA in the last 168  hours. Coagulation Profile: No results for input(s): INR, PROTIME in the last 168 hours. Cardiac Enzymes: Recent Labs  Lab 04/20/21 0614  CKTOTAL 208   BNP (last 3 results) No results for input(s): PROBNP in the last 8760 hours. HbA1C: No results for input(s): HGBA1C in the last 72 hours. CBG: No results for input(s): GLUCAP in the last 168 hours. Lipid Profile: No results for input(s): CHOL, HDL, LDLCALC, TRIG, CHOLHDL, LDLDIRECT in the last 72 hours. Thyroid Function Tests: Recent Labs    04/20/21 0614  TSH 2.696   Anemia Panel: No results for input(s): VITAMINB12, FOLATE, FERRITIN, TIBC, IRON, RETICCTPCT in the last 72 hours. Sepsis Labs: No results for input(s): PROCALCITON, LATICACIDVEN in the last 168 hours.  Recent Results (from the past 240  hour(s))  Culture, Urine     Status: Abnormal   Collection Time: 04/19/21 11:33 PM   Specimen: Urine, Clean Catch  Result Value Ref Range Status   Specimen Description   Final    URINE, CLEAN CATCH Performed at [facility], [place] [address] [place], [place] [address]    Special Requests   Final    NONE Performed at [facility], [place] [address], [place], [place] [address]    Culture (A)  Final    <10,000 COLONIES/mL INSIGNIFICANT GROWTH Performed at [place] [address], [place], [place] [address]    Report Status 04/21/2021 FINAL  Final  SARS CORONAVIRUS 2 (TAT 6-24 HRS) Nasopharyngeal Nasopharyngeal Swab     Status: None   Collection Time: 04/20/21 12:48 AM   Specimen: Nasopharyngeal Swab  Result Value Ref Range Status   SARS Coronavirus 2 NEGATIVE NEGATIVE Final    Comment: (NOTE) SARS-CoV-2 target nucleic acids are NOT DETECTED.  The SARS-CoV-2 RNA is generally detectable in upper and lower respiratory specimens during the acute phase of infection. Negative results do not preclude SARS-CoV-2 infection, do not rule out co-infections with other pathogens, and  should not be used as the sole basis for treatment or other patient management decisions. Negative results must be combined with clinical observations, patient history, and epidemiological information. The expected result is Negative.  Fact Sheet for Patients: SugarRoll.be  Fact Sheet for Healthcare Providers: https://www.woods-mathews.com/  This test is not yet approved or cleared by the [place] FDA and  has been authorized for detection and/or diagnosis of SARS-CoV-2 by FDA under an Emergency Use Authorization (EUA). This EUA will remain  in effect (meaning this test can be used) for the duration of the COVID-19 declaration under Se ction 564(b)(1) of the Act, 21 U.S.C. section 360bbb-3(b)(1), unless the authorization is terminated or revoked sooner.  Performed at [facility], [place] [address], [place],  [address]     Radiology Studies: CT ABDOMEN PELVIS WO CONTRAST  Result Date: 04/19/2021 CLINICAL DATA:  Abdominal abscess/infection suspected Flank pain with dark urine EXAM: CT ABDOMEN AND PELVIS WITHOUT CONTRAST TECHNIQUE: Multidetector CT imaging of the abdomen and pelvis was performed following the standard protocol without IV contrast. COMPARISON:  None. FINDINGS: Lower chest: Peripheral honeycombing versus emphysema in the right lower lobe. Mild bilateral lower lobe bronchiectasis. No acute airspace disease. Normal heart size with coronary artery calcifications. Hepatobiliary: No focal liver abnormality on this noncontrast exam. Diffuse high-density contents of the gallbladder, may be stones/sludge. No pericholecystic fat stranding or inflammation. There is proximal common bile duct dilatation measuring up to 17 mm with truncation distally. No visualized choledocholithiasis. Pancreas: Parenchymal atrophy. There is rounded soft tissue fullness in the pancreatic head spanning 2.9 cm. Mild adjacent fat stranding about the  pancreatic head and uncinate process. Proximal pancreatic ductal dilatation at 5 mm. Spleen: Normal in size without focal abnormality. Adrenals/Urinary Tract: Normal adrenal glands. Mild right hydroureteronephrosis secondary to a 4 mm obstructing stone in the right mid ureter, series 2, image 50. Minimal right perinephric edema. More distal ureters decompressed. No definite additional intrarenal calculi or focal intrarenal lesion. Cystic changes at the left renal hilum likely represent cortical and parapelvic cysts. This includes an exophytic cyst arising from the lower kidney measuring 9.6 cm. Left intrarenal calculi, largest in the mid kidney measures 8 mm. No ureteral stone. Partially distended urinary bladder without bladder stone or wall thickening. Stomach/Bowel: Small hiatal hernia. Ingested  material distends the stomach. No small bowel obstruction or inflammation. Normal appendix. Diverticulosis of the descending and sigmoid colon, prominent in the sigmoid. No diverticulitis. Vascular/Lymphatic: Moderate aortic atherosclerosis without aneurysm. There may be a small peripancreatic nodes, not well assessed on this noncontrast exam. Reproductive: Prominent prostate gland spans 4.8 cm and causes mild mass effect on the bladder base. Other: No ascites or free air. Tiny fat containing umbilical hernia. 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 No acute osseous abnormality or evidence of focal bone lesion. IMPRESSION: 1. Mild right hydroureteronephrosis secondary to a 4 mm obstructing stone in the right mid ureter. 2. Rounded soft tissue fullness in the pancreatic head spanning 2.9 cm with mild adjacent fat stranding about the pancreatic head and uncinate process. Recommend further evaluation with pancreatic protocol MRI after resolution of acute event. There is proximal pancreatic as well as common bile duct dilatation. There  may be a small peripancreatic nodes, not well assessed on this noncontrast exam. 3. High-density contents of the gallbladder, may be stones/sludge. No pericholecystic fat stranding or inflammation. 4. Left intrarenal calculi. Left renal cortical and parapelvic cysts. 5. Colonic diverticulosis without diverticulitis. 6. Enlarged prostate gland causing mild mass effect on the bladder base. 7. 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 Aortic Atherosclerosis (ICD10-I70.0). Electronically Signed   By: Keith Rake M.D.   On: 04/19/2021 20:51   DG Chest 2 View  Result Date: 04/20/2021 CLINICAL DATA:  Pancreatic mass EXAM: CHEST - 2 VIEW COMPARISON:  None. FINDINGS: The lungs are symmetrically expanded. A 9 mm indeterminate nodule is seen within the a right apex. The lungs are otherwise clear. No pneumothorax or pleural effusion. Cardiac size within normal limits. Pulmonary vascularity is normal. Osseous structures are age-appropriate. No acute bone abnormality. IMPRESSION: 9 mm indeterminate right upper lobe pulmonary nodule. Dedicated CT imaging is recommended for further evaluation if no prior examinations are available to establish chronicity. Electronically Signed   By: Fidela Salisbury MD   On: 04/20/2021 00:21   MR 3D Recon At Scanner  Result Date: 04/20/2021 CLINICAL DATA:  Soft tissue fullness in the pancreatic head on recent abdomen/pelvis CT. EXAM: MRI ABDOMEN WITHOUT AND WITH CONTRAST (INCLUDING MRCP) TECHNIQUE: Multiplanar multisequence MR imaging of the abdomen was performed both before and after the administration of intravenous contrast. Heavily T2-weighted images of the biliary and pancreatic ducts were obtained, and three-dimensional MRCP images were rendered by post processing. CONTRAST:  4mL GADAVIST GADOBUTROL 1 MMOL/ML IV SOLN COMPARISON:  Abdomen/pelvis CT 04/19/2021 FINDINGS: Lower chest: Unremarkable Hepatobiliary:  Intra and extrahepatic biliary duct dilatation is fairly marked in character. The common duct in the porta hepatis measures 18 mm diameter. There is abrupt cut off of the common bile duct at the level of the pancreatic head. Gallbladder sludge noted without gallstones evident. Pancreas: 2.9 x 2.4 2.6 cm soft tissue lesion is identified in the head of the pancreas. This lesion restricts diffusion and shows peripheral enhancement around a region of central hypoenhancement. There is diffuse atrophy of pancreatic parenchyma in the body and tail of pancreas with diffuse dilatation of the main pancreatic duct abruptly terminating at the level of this lesion. Spleen:  No splenomegaly. No focal mass lesion. Adrenals/Urinary Tract: No adrenal nodule or mass. Central sinus cysts are noted in the right kidney. Large central sinus cysts are noted in the left kidney with a 10.1 cm simple cyst in  the lower pole. Stomach/Bowel: Stomach is unremarkable. No gastric wall thickening. No evidence of outlet obstruction. Duodenum is normally positioned as is the ligament of Treitz. No small bowel or colonic dilatation within the visualized abdomen. Diverticular changes are noted in the sigmoid colon. Vascular/Lymphatic: Portal vein, superior mesenteric vein and splenic vein are patent. Postcontrast imaging is motion degraded, but the pancreatic head mass does abut and is contiguous with the portal splenic confluence over a distance of approximately 2.4 cm. No evidence for abnormal soft tissue around the celiac axis. Fat planes around the common hepatic artery appear preserved. Pancreatic head mass does not abut or in case the superior mesenteric artery. No abdominal lymphadenopathy Other:  No intraperitoneal free fluid. Musculoskeletal: No focal suspicious marrow enhancement within the visualized bony anatomy. IMPRESSION: 1. 2.9 x 2.4 x 2.6 cm soft tissue lesion in the head of the pancreas consistent with pancreatic adenocarcinoma. The  lesion abruptly obstructs the common bile duct and the pancreatic duct. The pancreatic head mass abuts and slightly attenuates the portal splenic confluence but there is no evidence for involvement of the celiac axis or SMA. 2. Portal vein, superior mesenteric vein, and splenic vein are patent. No evidence for retroperitoneal lymphadenopathy in the visualized abdomen. 3. Gallbladder sludge without gallstones. 4. Bilateral renal cysts. Electronically Signed   By: Misty Stanley M.D.   On: 04/20/2021 20:15   DG ERCP BILIARY & PANCREATIC DUCTS  Result Date: 04/21/2021 CLINICAL DATA:  Pancreatic head lesion EXAM: ERCP TECHNIQUE: Multiple spot images obtained with the fluoroscopic device and submitted for interpretation post-procedure. COMPARISON:  MRCP from previous day FINDINGS: A series of fluoroscopic images document endoscopic cannulation and opacification of the CBD, passage of balloon catheter into the distal CBD, and metallic stent deployment across the distal CBD. There is incomplete opacification of the CBD which appears dilated proximally near the biliary confluence. There is incomplete opacification of the biliary tree which appears mildly dilated centrally. No evidence of extravasation. Persistent tapered narrowing in the midportion of the stent on the final image. IMPRESSION: Endoscopic balloon angioplasty and stenting of distal CBD lesion. These images were submitted for radiologic interpretation only. Please see the procedural report for the amount of contrast and the fluoroscopy time utilized. Electronically Signed   By: Lucrezia Europe M.D.   On: 04/21/2021 15:51   MR ABDOMEN MRCP W WO CONTAST  Result Date: 04/20/2021 CLINICAL DATA:  Soft tissue fullness in the pancreatic head on recent abdomen/pelvis CT. EXAM: MRI ABDOMEN WITHOUT AND WITH CONTRAST (INCLUDING MRCP) TECHNIQUE: Multiplanar multisequence MR imaging of the abdomen was performed both before and after the administration of intravenous  contrast. Heavily T2-weighted images of the biliary and pancreatic ducts were obtained, and three-dimensional MRCP images were rendered by post processing. CONTRAST:  39mL GADAVIST GADOBUTROL 1 MMOL/ML IV SOLN COMPARISON:  Abdomen/pelvis CT 04/19/2021 FINDINGS: Lower chest: Unremarkable Hepatobiliary: Intra and extrahepatic biliary duct dilatation is fairly marked in character. The common duct in the porta hepatis measures 18 mm diameter. There is abrupt cut off of the common bile duct at the level of the pancreatic head. Gallbladder sludge noted without gallstones evident. Pancreas: 2.9 x 2.4 2.6 cm soft tissue lesion is identified in the head of the pancreas. This lesion restricts diffusion and shows peripheral enhancement around a region of central hypoenhancement. There is diffuse atrophy of pancreatic parenchyma in the body and tail of pancreas with diffuse dilatation of the main pancreatic duct abruptly terminating at the level of this lesion. Spleen:  No splenomegaly. No focal mass lesion. Adrenals/Urinary Tract: No adrenal nodule or mass. Central sinus cysts are noted in the right kidney. Large central sinus cysts are noted in the left kidney with a 10.1 cm simple cyst in the lower pole. Stomach/Bowel: Stomach is unremarkable. No gastric wall thickening. No evidence of outlet obstruction. Duodenum is normally positioned as is the ligament of Treitz. No small bowel or colonic dilatation within the visualized abdomen. Diverticular changes are noted in the sigmoid colon. Vascular/Lymphatic: Portal vein, superior mesenteric vein and splenic vein are patent. Postcontrast imaging is motion degraded, but the pancreatic head mass does abut and is contiguous with the portal splenic confluence over a distance of approximately 2.4 cm. No evidence for abnormal soft tissue around the celiac axis. Fat planes around the common hepatic artery appear preserved. Pancreatic head mass does not abut or in case the superior  mesenteric artery. No abdominal lymphadenopathy Other:  No intraperitoneal free fluid. Musculoskeletal: No focal suspicious marrow enhancement within the visualized bony anatomy. IMPRESSION: 1. 2.9 x 2.4 x 2.6 cm soft tissue lesion in the head of the pancreas consistent with pancreatic adenocarcinoma. The lesion abruptly obstructs the common bile duct and the pancreatic duct. The pancreatic head mass abuts and slightly attenuates the portal splenic confluence but there is no evidence for involvement of the celiac axis or SMA. 2. Portal vein, superior mesenteric vein, and splenic vein are patent. No evidence for retroperitoneal lymphadenopathy in the visualized abdomen. 3. Gallbladder sludge without gallstones. 4. Bilateral renal cysts. Electronically Signed   By: Misty Stanley M.D.   On: 04/20/2021 20:15    Scheduled Meds: . acetaminophen  650 mg Oral BID  . aspirin EC  81 mg Oral Daily  . atorvastatin  40 mg Oral Daily  . buPROPion  100 mg Oral Daily  . dorzolamide-timolol  1 drop Left Eye BID  . isosorbide mononitrate  60 mg Oral Daily  . losartan  50 mg Oral Daily  . [START ON 04/22/2021] metoprolol succinate  12.5 mg Oral Daily  . pantoprazole  40 mg Oral Daily  . potassium chloride  40 mEq Oral Once  . prednisoLONE acetate  1 drop Both Eyes QID  . tamsulosin  0.4 mg Oral QPC supper   Continuous Infusions: . sodium chloride 100 mL/hr at 04/21/21 1233  . lactated ringers Stopped (04/21/21 1514)  . potassium chloride 10 mEq (04/21/21 1526)     LOS: 2 days    Time spent: 25 mins    [place] Latulippe, MD [facility]   If 7PM-7AM, please contact night-coverage

## 2021-04-21 NOTE — Progress Notes (Signed)
Quiet night, except for frequent urination.  No abdominal pain or evidence of cholangitis.  Patient remains afebrile.  Persistent hypokalemia present, potassium is 2.9 this morning.  IV potassium ordered.  I have also ordered a one-time dose of oral potassium.  Liver chemistries are stable, white count remains normal.  Somewhat surprisingly, CA 19-9 level is normal.  His MRI/MRCP is consistent with pancreatic cancer, but there is no evidence of adenopathy or distant disease.  For ERCP later today.  If brushings obtained during that study are negative, we will arrange outpatient EUS/FNA to obtain a tissue diagnosis.  Per discussion with the intervention radiologist, it is not typical to start out with a percutaneous pancreatic biopsy.  Cleotis Nipper, M.D. Pager [phone] If no answer or after 5 PM call [phone]

## 2021-04-21 NOTE — Op Note (Addendum)
[facility] Patient Name: Maurice Schneider Procedure Date: 04/21/2021 MRN: [id number] Attending MD: Ronnette Juniper , MD Date of Birth: Feb 09, 1941 CSN: [id number] Age: 80 Admit Type: Inpatient Procedure:                ERCP Indications:              Abnormal MRCP, Jaundice, Elevated liver enzymes,                            Tumor of the head of pancreas Providers:                Ronnette Juniper, MD, Kary Kos RN, RN, Elspeth Cho                            Tech., Technician, Danley Danker, CRNA Referring MD:             [facility] Medicines:                Monitored Anesthesia Care Complications:            No immediate complications. Estimated Blood Loss:     Estimated blood loss: none. Estimated blood loss:                            none. Procedure:                Pre-Anesthesia Assessment:                           - Prior to the procedure, a History and Physical                            was performed, and patient medications and                            allergies were reviewed. The patient's tolerance of                            previous anesthesia was also reviewed. The risks                            and benefits of the procedure and the sedation                            options and risks were discussed with the patient.                            All questions were answered, and informed consent                            was obtained. Prior Anticoagulants: The patient has                            taken no previous anticoagulant or antiplatelet  agents except for aspirin. ASA Grade Assessment:                            III - A patient with severe systemic disease. After                            reviewing the risks and benefits, the patient was                            deemed in satisfactory condition to undergo the                            procedure.                           After obtaining informed consent, the  scope was                            passed under direct vision. Throughout the                            procedure, the patient's blood pressure, pulse, and                            oxygen saturations were monitored continuously. The                            Olympus [id number]) was introduced through                            the mouth, and used to inject contrast into and                            used to cannulate the bile duct. The ERCP was                            accomplished without difficulty. The patient                            tolerated the procedure well. Scope In: Scope Out: Findings:      The scout film was normal.      The esophagus was successfully intubated under direct vision. The scope       was advanced to a normal major papilla in the descending duodenum       without detailed examination of the pharynx, larynx and associated       structures, and upper GI tract. The upper GI tract was grossly normal.      The bile duct was deeply cannulated with the sphincterotome. Contrast       was injected. I personally interpreted the bile duct images. There was       brisk flow of contrast through the ducts. Image quality was excellent.       Contrast extended to the main bile duct.      The upper third of the main bile duct was moderately dilated.  The       largest diameter was 15 mm.      The lower third of the main bile duct and middle third of the main bile       duct had obvious luminal narrowing, about 3 cm in length, most likely       related to extrinsic compression from pancreatic head mass.      A straight Roadrunner wire was passed into the biliary tree.      An 8 mm biliary sphincterotomy was made with a braided sphincterotome       using ERBE electrocautery. There was no post-sphincterotomy bleeding.      Cells for cytology were obtained by brushing in the lower third of the       main bile duct and the middle third of the main bile duct.       One 10 mm by 6 cm covered metal stent was placed 5 cm into the common       bile duct. Clear fluid flowed through the stent. The stent was in good       position.      The pancreatic duct was not injected or canulated during the procedure.      The patient was given rectal indomethacin at the beginning of the       procedure. Impression:               - Luminal narrowing in the lower third of the main                            bile duct and middle third of the main bile duct.                            The stricture was likely related to extrinsic                            compression from pancreatic head mass.                           - The upper third of the main bile duct was                            moderately dilated.                           - A biliary sphincterotomy was performed.                           - Cells for cytology obtained in the lower third of                            the main duct and in the middle third of the main                            bile duct.                           - One covered metal stent was placed into the  common bile duct. Moderate Sedation:      Patient did not receive moderate sedation for this procedure, but       instead received monitored anesthesia care. Recommendation:           - Full liquid diet.                           - Await cytology results.                           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                           Recommend surgical evaluation for localized tumor. Procedure Code(s):        --- Professional ---                           [phone], Endoscopic retrograde                            cholangiopancreatography (ERCP); with placement of                            endoscopic stent into biliary or pancreatic duct,                            including pre-  and post-dilation and guide wire                            passage, when performed, including sphincterotomy,                            when performed, each stent Diagnosis Code(s):        --- Professional ---                           K83.1, Obstruction of bile duct                           R17, Unspecified jaundice                           R74.8, Abnormal levels of other serum enzymes                           D49.0, Neoplasm of unspecified behavior of                            digestive system  K83.8, Other specified diseases of biliary tract                           R93.2, Abnormal findings on diagnostic imaging of                            liver and biliary tract CPT copyright 2019 [employer]. All rights reserved. The codes documented in this report are preliminary and upon coder review may  be revised to meet current compliance requirements. Ronnette Juniper, MD 04/21/2021 2:14:08 PM This report has been signed electronically. Number of Addenda: 0

## 2021-04-21 NOTE — Anesthesia Postprocedure Evaluation (Signed)
Anesthesia Post Note  Patient: Maurice Schneider  Procedure(s) Performed: ENDOSCOPIC RETROGRADE CHOLANGIOPANCREATOGRAPHY (ERCP) (N/A ) SPHINCTEROTOMY BILIARY STENT PLACEMENT (N/A ) BILIARY BRUSHING     Patient location during evaluation: PACU Anesthesia Type: General Level of consciousness: awake and alert and oriented Pain management: pain level controlled Vital Signs Assessment: post-procedure vital signs reviewed and stable Respiratory status: spontaneous breathing, nonlabored ventilation and respiratory function stable Cardiovascular status: blood pressure returned to baseline Postop Assessment: no apparent nausea or vomiting Anesthetic complications: no   No complications documented.  Last Vitals:  Vitals:   04/21/21 1430 04/21/21 1441  BP: 131/60 132/61  Pulse: (!) 54 (!) 50  Resp: 10 14  Temp:    SpO2: 95% 94%    Last Pain:  Vitals:   04/21/21 1441  TempSrc:   PainSc: 0-No pain                 Brennan Bailey

## 2021-04-22 DIAGNOSIS — N132 Hydronephrosis with renal and ureteral calculous obstruction: Secondary | ICD-10-CM | POA: Diagnosis not present

## 2021-04-22 DIAGNOSIS — K8689 Other specified diseases of pancreas: Secondary | ICD-10-CM | POA: Diagnosis not present

## 2021-04-22 LAB — COMPREHENSIVE METABOLIC PANEL
ALT: 333 U/L — ABNORMAL HIGH (ref 0–44)
AST: 327 U/L — ABNORMAL HIGH (ref 15–41)
Albumin: 2.8 g/dL — ABNORMAL LOW (ref 3.5–5.0)
Alkaline Phosphatase: 591 U/L — ABNORMAL HIGH (ref 38–126)
Anion gap: 10 (ref 5–15)
BUN: 19 mg/dL (ref 8–23)
CO2: 20 mmol/L — ABNORMAL LOW (ref 22–32)
Calcium: 8.4 mg/dL — ABNORMAL LOW (ref 8.9–10.3)
Chloride: 110 mmol/L (ref 98–111)
Creatinine, Ser: 1.46 mg/dL — ABNORMAL HIGH (ref 0.61–1.24)
GFR, Estimated: 49 mL/min — ABNORMAL LOW (ref >60)
Glucose, Bld: 156 mg/dL — ABNORMAL HIGH (ref 70–99)
Potassium: 4.2 mmol/L (ref 3.5–5.1)
Sodium: 140 mmol/L (ref 135–145)
Total Bilirubin: 4.1 mg/dL — ABNORMAL HIGH (ref 0.3–1.2)
Total Protein: 5.9 g/dL — ABNORMAL LOW (ref 6.5–8.1)

## 2021-04-22 LAB — PHOSPHORUS: Phosphorus: 3.3 mg/dL (ref 2.5–4.6)

## 2021-04-22 LAB — CBC
HCT: 33.5 % — ABNORMAL LOW (ref 39.0–52.0)
Hemoglobin: 11.4 g/dL — ABNORMAL LOW (ref 13.0–17.0)
MCH: 32 pg (ref 26.0–34.0)
MCHC: 34 g/dL (ref 30.0–36.0)
MCV: 94.1 fL (ref 80.0–100.0)
Platelets: 123 10*3/uL — ABNORMAL LOW (ref 150–400)
RBC: 3.56 MIL/uL — ABNORMAL LOW (ref 4.22–5.81)
RDW: 13.7 % (ref 11.5–15.5)
WBC: 4.7 10*3/uL (ref 4.0–10.5)
nRBC: 0 % (ref 0.0–0.2)

## 2021-04-22 LAB — MAGNESIUM: Magnesium: 1.6 mg/dL — ABNORMAL LOW (ref 1.7–2.4)

## 2021-04-22 MED ORDER — TAMSULOSIN HCL 0.4 MG PO CAPS: 0.4000 mg | ORAL_CAPSULE | Freq: Every day | ORAL | 0 refills | Status: AC

## 2021-04-22 MED ORDER — MAGNESIUM SULFATE 2 GM/50ML IV SOLN
2.0000 g | Freq: Once | INTRAVENOUS | Status: AC
  Administered 2021-04-22: 2 g via INTRAVENOUS
  Filled 2021-04-22: qty 50

## 2021-04-22 NOTE — Discharge Instructions (Signed)
Advised to follow up PCP in one week. Advised to follow up Urology as scheduled. Advised to follow-up with GI.  Appointment will be made. Advised to take Flomax 0.4 mg daily for kidney stone

## 2021-04-22 NOTE — Progress Notes (Signed)
   04/22/21 0435  Assess: MEWS Score  Temp (!) 97.4 F (36.3 C)  BP (!) 137/57  Pulse Rate (!) 41  Resp 12  SpO2 93 %  O2 Device Room Air  Assess: MEWS Score  MEWS Temp 0  MEWS Systolic 0  MEWS Pulse 1  MEWS RR 1  MEWS LOC 0  MEWS Score 2  MEWS Score Color Yellow  Assess: if the MEWS score is Yellow or Red  Were vital signs taken at a resting state? Yes  Focused Assessment Change from prior assessment (see assessment flowsheet)  Does the patient meet 2 or more of the SIRS criteria? No  MEWS guidelines implemented *See Row Information* Yes  Treat  MEWS Interventions Other (Comment) (pending)  Pain Scale 0-10  Pain Score 0  Take Vital Signs  Increase Vital Sign Frequency  Yellow: Q 2hr X 2 then Q 4hr X 2, if remains yellow, continue Q 4hrs  Escalate  MEWS: Escalate Yellow: discuss with charge nurse/RN and consider discussing with provider and RRT  Notify: Charge Nurse/RN  Name of Charge Nurse/RN Notified Raquel Sarna  Date Charge Nurse/RN Notified 04/22/21  Time Charge Nurse/RN Notified [id number]  Notify: Provider  Provider Name/Title Kyere NP  Date Provider Notified 04/22/21  Time Provider Notified [phone]  Notification Type Page  Notification Reason Change in status  Provider response  (pending)  Document  Patient Outcome  (asymptomatic)  Progress note created (see row info) Yes  Assess: SIRS CRITERIA  SIRS Temperature  0  SIRS Pulse 0  SIRS Respirations  0  SIRS WBC 0  SIRS Score Sum  0

## 2021-04-22 NOTE — Progress Notes (Signed)
Pt going home with his spouse. Alert and oriented. Discharge instructions given/explained with pt verbalizing understanding.  Pt aware to pickup prescriptions and followup accordingly.

## 2021-04-22 NOTE — Progress Notes (Addendum)
Pt is essentially pain free since yesterday's stent placement.  Pt in good spirits, abd NT.  Appears less jaundiced.  LFT's significantly improved.  Bile duct brushings pending.  IMPR:  1. No evident post-ERCP complications  2. Abd pain improved--not clear if it's due to kidney stone passage or due to the benefit of biliary decompression.  RECOMM:  1. Have advanced diet  2. OK for dischg from GI standpoint if pt tolerates lunch  3. Dr. Therisa Doyne will contact patient with results of brushings  4. Not clear if patient has f/u with General Surgery arranged, but he will probably need that eventually, depending on findings of workup.  5.  If cytologic brushings of bile duct come back negative, we will arrange for endoscopic ultrasound with fine-needle aspirate through our office (Dr. Paulita Fujita) to get tissue diagnosis.  If cytologic brushings of the bile duct come back positive, we will arrange for oncologic referral.   Call me if questions.   Cleotis Nipper, M.D. Pager [phone] If no answer or after 5 PM call [phone]

## 2021-04-22 NOTE — Discharge Summary (Addendum)
Physician Discharge Summary  Irby Fails ZOX:[id number] DOB: [birth year] DOA: 04/19/2021  [facility]: [facility]  Admit date: 04/19/2021   Discharge date: 04/22/2021  Admitted From: Home.  Disposition:  Home.  Recommendations for Outpatient Follow-up:  1. Follow up with PCP in 1-2 weeks. 2. Please obtain BMP/CBC in one week. 3. Advised to follow up Urology as scheduled. 4. Advised to follow-up with GI.  Appointment will be made. 5. Advised to take Flomax 0.4 mg daily for kidney stone  Home Health: None Equipment/Devices: None  Discharge Condition: Stable CODE STATUS:Full code Diet recommendation: Heart Healthy   Brief [facility] course: This 80 years old male with PMH significant for glaucoma, HTN, HLD, Barrett's esophagus, kidney stones, blindness, CAD presents in the ED with few days history of dark urine and flank pain.  Patient also reports having epigastric pain for about a week, denies any fever, chills. He also reports  Losing about 5 pounds.  Recently moved from [place]. CT abdomen/ pelvis shows mild right hydroureteronephrosis secondary to 4 mm obstructing stone in the right mid ureter.  There is a round soft tissue fullness in the pancreatic head suspicious for pancreatic mass. Patient was admitted for right flank pain,  found to have hydroureteronephrosis and pancreatic mass.  Patient was also jaundiced on arrival.  CT abdomen and pelvis shows pancreatic mass causing biliary obstruction. GI was consulted,  He underwent ERCP with stent placement . He tolerated procedure well.   Jaundice has significantly improved, liver enzymes has trended down,  bilirubin has improved.  Patient reports pain has completely resolved.  Patient has tolerated soft bland diet.  Patient was seen by urologist recommended medical expulsive therapy with hydration,  Flomax,  urine straining.  No acute urological intervention needed.   Patient was cleared from GI to be discharged.  Patient  will follow up with GI for results on brushing of the biliary tree during ERCP.  Patient will eventually need general surgery evaluation for localized pancreatic mass.  GI recommends If cytologic brushings of bile duct come back negative, we will arrange for endoscopic ultrasound with fine-needle aspirate through our office (Dr. Paulita Fujita) to get tissue diagnosis.  If cytologic brushings of the bile duct come back positive, we will arrange for oncologic referral.  He was managed for below problems.   Discharge Diagnoses:  Active Problems:   Hydronephrosis with renal and ureteral calculus obstruction   Pancreatic mass   Hypokalemia   AKI (acute kidney injury) (HCC)   Elevated LFTs   Essential hypertension   Hyperlipidemia   Pulmonary nodule  Right Flank pain sec.to  Hydronephrosis with renal and ureteral calculus obstruction: Discussed with urology,  recommended medical expulsive therapy with IV hydration, Flomax,  urine straining. Adequate Pain control.  Right flank pain has resolved,  it seems like patient has passed the kidney stone. Advised outpatient urology follow-up.  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 GI consulted.  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 Patient underwent ERCP with stent placement today to prevent cholangitis. Patient needs tissue diagnosis,  will be scheduled outpatient since no endoscopic ultrasound available next few days. General surgery consulted for localized pancreatic mass.  Outpatient general surgical evaluation.  Hypokalemia: Improved  AKI : >  Improved. It is unclear if CKD versus acute change.  Gentle hydration.  Recheck labs in the morning.  Elevated LFTs : most likely secondary to obstruction.  GI patient underwent ERCP with a stent placement. LFTs are trending down.  Continue to monitor.  Essential hypertension: Resume Coreg and Imdur.  Hold of ARB given  possible AKI.  Hyperlipidemia: hold statins since patient has elevated liver enzymes.  Pulmonary nodulewill need further imaging if renal function improves after fluid rehydration and passing kidney stone.  considering CT with contrast .   Discharge Instructions  Discharge Instructions    Call MD for:  difficulty breathing, headache or visual disturbances   Complete by: As directed    Call MD for:  persistant dizziness or light-headedness   Complete by: As directed    Call MD for:  severe uncontrolled pain   Complete by: As directed    Call MD for:  temperature >100.4   Complete by: As directed    Diet - low sodium heart healthy   Complete by: As directed    Diet Carb Modified   Complete by: As directed    Discharge instructions   Complete by: As directed    Advised to follow up PCP in one week. Advised to follow up Urology as scheduled. Advised to follow-up with GI.  Appointment will be made. Advised to take Flomax 0.4 mg daily for kidney stone   Increase activity slowly   Complete by: As directed      Allergies as of 04/22/2021      Reactions   Penicillins Anaphylaxis      Medication List    STOP taking these medications   atorvastatin 80 MG tablet Commonly known as: LIPITOR     TAKE these medications   acetaminophen 650 MG CR tablet Commonly known as: TYLENOL Take 650 mg by mouth 2 (two) times daily.   amLODipine 5 MG tablet Commonly known as: NORVASC Take 5 mg by mouth daily.   aspirin 81 MG EC tablet Take 81 mg by mouth daily.   B COMPLEX 1 PO Take 1 tablet by mouth daily.   buPROPion 100 MG tablet Commonly known as: WELLBUTRIN Take 100 mg by mouth daily.   cetirizine 10 MG tablet Commonly known as: ZYRTEC Take 10 mg by mouth daily.   dorzolamide-timolol 22.3-6.8 MG/ML ophthalmic solution Commonly known as: COSOPT Place 1 drop into the left eye 2 (two) times daily.   isosorbide mononitrate 60 MG 24 hr tablet Commonly known as:  IMDUR Take 60 mg by mouth daily.   losartan 100 MG tablet Commonly known as: COZAAR Take 100 tablets by mouth daily.   metoprolol succinate 25 MG 24 hr tablet Commonly known as: TOPROL-XL Take 12.5 mg by mouth daily.   omeprazole 20 MG capsule Commonly known as: PRILOSEC Take 20 mg by mouth daily.   prednisoLONE acetate 1 % ophthalmic suspension Commonly known as: PRED FORTE Place 1 drop into both eyes 4 (four) times daily.   tamsulosin 0.4 MG Caps capsule Commonly known as: FLOMAX Take 1 capsule (0.4 mg total) by mouth daily after supper.       Follow-up Information    [facility] Follow up in 1 week(s).   Contact information: [place] [address] [phone]        Ronnette Juniper, MD Follow up in 1 week(s).   Specialty: Gastroenterology Contact information: [address] [place] [address] [phone]              Allergies  Allergen Reactions  . Penicillins Anaphylaxis    Consultations:  Urology  GI   Procedures/Studies: CT ABDOMEN PELVIS WO CONTRAST  Result Date: 04/19/2021 CLINICAL DATA:  Abdominal abscess/infection suspected  Flank pain with dark urine EXAM: CT ABDOMEN AND PELVIS WITHOUT CONTRAST TECHNIQUE: Multidetector CT imaging of the abdomen and pelvis was performed following the standard protocol without IV contrast. COMPARISON:  None. FINDINGS: Lower chest: Peripheral honeycombing versus emphysema in the right lower lobe. Mild bilateral lower lobe bronchiectasis. No acute airspace disease. Normal heart size with coronary artery calcifications. Hepatobiliary: No focal liver abnormality on this noncontrast exam. Diffuse high-density contents of the gallbladder, may be stones/sludge. No pericholecystic fat stranding or inflammation. There is proximal common bile duct dilatation measuring up to 17 mm with truncation distally. No visualized choledocholithiasis. Pancreas: Parenchymal atrophy. There is rounded  soft tissue fullness in the pancreatic head spanning 2.9 cm. Mild adjacent fat stranding about the pancreatic head and uncinate process. Proximal pancreatic ductal dilatation at 5 mm. Spleen: Normal in size without focal abnormality. Adrenals/Urinary Tract: Normal adrenal glands. Mild right hydroureteronephrosis secondary to a 4 mm obstructing stone in the right mid ureter, series 2, image 50. Minimal right perinephric edema. More distal ureters decompressed. No definite additional intrarenal calculi or focal intrarenal lesion. Cystic changes at the left renal hilum likely represent cortical and parapelvic cysts. This includes an exophytic cyst arising from the lower kidney measuring 9.6 cm. Left intrarenal calculi, largest in the mid kidney measures 8 mm. No ureteral stone. Partially distended urinary bladder without bladder stone or wall thickening. Stomach/Bowel: Small hiatal hernia. Ingested material distends the stomach. No small bowel obstruction or inflammation. Normal appendix. Diverticulosis of the descending and sigmoid colon, prominent in the sigmoid. No diverticulitis. Vascular/Lymphatic: Moderate aortic atherosclerosis without aneurysm. There may be a small peripancreatic nodes, not well assessed on this noncontrast exam. Reproductive: Prominent prostate gland spans 4.8 cm and causes mild mass effect on the bladder base. Other: No ascites or free air. Tiny fat containing umbilical hernia. 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 No acute osseous abnormality or evidence of focal bone lesion. IMPRESSION: 1. Mild right hydroureteronephrosis secondary to a 4 mm obstructing stone in the right mid ureter. 2. Rounded soft tissue fullness in the pancreatic head spanning 2.9 cm with mild adjacent fat stranding about the pancreatic head and uncinate process. Recommend further evaluation with pancreatic protocol MRI after  resolution of acute event. There is proximal pancreatic as well as common bile duct dilatation. There may be a small peripancreatic nodes, not well assessed on this noncontrast exam. 3. High-density contents of the gallbladder, may be stones/sludge. No pericholecystic fat stranding or inflammation. 4. Left intrarenal calculi. Left renal cortical and parapelvic cysts. 5. Colonic diverticulosis without diverticulitis. 6. Enlarged prostate gland causing mild mass effect on the bladder base. 7. 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 Aortic Atherosclerosis (ICD10-I70.0). Electronically Signed   By: Keith Rake M.D.   On: 04/19/2021 20:51   DG Chest 2 View  Result Date: 04/20/2021 CLINICAL DATA:  Pancreatic mass EXAM: CHEST - 2 VIEW COMPARISON:  None. FINDINGS: The lungs are symmetrically expanded. A 9 mm indeterminate nodule is seen within the a right apex. The lungs are otherwise clear. No pneumothorax or pleural effusion. Cardiac size within normal limits. Pulmonary vascularity is normal. Osseous structures are age-appropriate. No acute bone abnormality. IMPRESSION: 9 mm indeterminate right upper lobe pulmonary nodule. Dedicated CT imaging is recommended for further evaluation if no prior examinations are available to establish chronicity. Electronically Signed   By: Fidela Salisbury MD   On: 04/20/2021  00:21   MR 3D Recon At Scanner  Result Date: 04/20/2021 CLINICAL DATA:  Soft tissue fullness in the pancreatic head on recent abdomen/pelvis CT. EXAM: MRI ABDOMEN WITHOUT AND WITH CONTRAST (INCLUDING MRCP) TECHNIQUE: Multiplanar multisequence MR imaging of the abdomen was performed both before and after the administration of intravenous contrast. Heavily T2-weighted images of the biliary and pancreatic ducts were obtained, and three-dimensional MRCP images were rendered by post processing. CONTRAST:  75mL GADAVIST GADOBUTROL 1 MMOL/ML IV  SOLN COMPARISON:  Abdomen/pelvis CT 04/19/2021 FINDINGS: Lower chest: Unremarkable Hepatobiliary: Intra and extrahepatic biliary duct dilatation is fairly marked in character. The common duct in the porta hepatis measures 18 mm diameter. There is abrupt cut off of the common bile duct at the level of the pancreatic head. Gallbladder sludge noted without gallstones evident. Pancreas: 2.9 x 2.4 2.6 cm soft tissue lesion is identified in the head of the pancreas. This lesion restricts diffusion and shows peripheral enhancement around a region of central hypoenhancement. There is diffuse atrophy of pancreatic parenchyma in the body and tail of pancreas with diffuse dilatation of the main pancreatic duct abruptly terminating at the level of this lesion. Spleen:  No splenomegaly. No focal mass lesion. Adrenals/Urinary Tract: No adrenal nodule or mass. Central sinus cysts are noted in the right kidney. Large central sinus cysts are noted in the left kidney with a 10.1 cm simple cyst in the lower pole. Stomach/Bowel: Stomach is unremarkable. No gastric wall thickening. No evidence of outlet obstruction. Duodenum is normally positioned as is the ligament of Treitz. No small bowel or colonic dilatation within the visualized abdomen. Diverticular changes are noted in the sigmoid colon. Vascular/Lymphatic: Portal vein, superior mesenteric vein and splenic vein are patent. Postcontrast imaging is motion degraded, but the pancreatic head mass does abut and is contiguous with the portal splenic confluence over a distance of approximately 2.4 cm. No evidence for abnormal soft tissue around the celiac axis. Fat planes around the common hepatic artery appear preserved. Pancreatic head mass does not abut or in case the superior mesenteric artery. No abdominal lymphadenopathy Other:  No intraperitoneal free fluid. Musculoskeletal: No focal suspicious marrow enhancement within the visualized bony anatomy. IMPRESSION: 1. 2.9 x 2.4 x 2.6  cm soft tissue lesion in the head of the pancreas consistent with pancreatic adenocarcinoma. The lesion abruptly obstructs the common bile duct and the pancreatic duct. The pancreatic head mass abuts and slightly attenuates the portal splenic confluence but there is no evidence for involvement of the celiac axis or SMA. 2. Portal vein, superior mesenteric vein, and splenic vein are patent. No evidence for retroperitoneal lymphadenopathy in the visualized abdomen. 3. Gallbladder sludge without gallstones. 4. Bilateral renal cysts. Electronically Signed   By: Misty Stanley M.D.   On: 04/20/2021 20:15   DG ERCP BILIARY & PANCREATIC DUCTS  Result Date: 04/21/2021 CLINICAL DATA:  Pancreatic head lesion EXAM: ERCP TECHNIQUE: Multiple spot images obtained with the fluoroscopic device and submitted for interpretation post-procedure. COMPARISON:  MRCP from previous day FINDINGS: A series of fluoroscopic images document endoscopic cannulation and opacification of the CBD, passage of balloon catheter into the distal CBD, and metallic stent deployment across the distal CBD. There is incomplete opacification of the CBD which appears dilated proximally near the biliary confluence. There is incomplete opacification of the biliary tree which appears mildly dilated centrally. No evidence of extravasation. Persistent tapered narrowing in the midportion of the stent on the final image. IMPRESSION: Endoscopic balloon angioplasty and stenting of distal  CBD lesion. These images were submitted for radiologic interpretation only. Please see the procedural report for the amount of contrast and the fluoroscopy time utilized. Electronically Signed   By: Lucrezia Europe M.D.   On: 04/21/2021 15:51   MR ABDOMEN MRCP W WO CONTAST  Result Date: 04/20/2021 CLINICAL DATA:  Soft tissue fullness in the pancreatic head on recent abdomen/pelvis CT. EXAM: MRI ABDOMEN WITHOUT AND WITH CONTRAST (INCLUDING MRCP) TECHNIQUE: Multiplanar multisequence MR  imaging of the abdomen was performed both before and after the administration of intravenous contrast. Heavily T2-weighted images of the biliary and pancreatic ducts were obtained, and three-dimensional MRCP images were rendered by post processing. CONTRAST:  24mL GADAVIST GADOBUTROL 1 MMOL/ML IV SOLN COMPARISON:  Abdomen/pelvis CT 04/19/2021 FINDINGS: Lower chest: Unremarkable Hepatobiliary: Intra and extrahepatic biliary duct dilatation is fairly marked in character. The common duct in the porta hepatis measures 18 mm diameter. There is abrupt cut off of the common bile duct at the level of the pancreatic head. Gallbladder sludge noted without gallstones evident. Pancreas: 2.9 x 2.4 2.6 cm soft tissue lesion is identified in the head of the pancreas. This lesion restricts diffusion and shows peripheral enhancement around a region of central hypoenhancement. There is diffuse atrophy of pancreatic parenchyma in the body and tail of pancreas with diffuse dilatation of the main pancreatic duct abruptly terminating at the level of this lesion. Spleen:  No splenomegaly. No focal mass lesion. Adrenals/Urinary Tract: No adrenal nodule or mass. Central sinus cysts are noted in the right kidney. Large central sinus cysts are noted in the left kidney with a 10.1 cm simple cyst in the lower pole. Stomach/Bowel: Stomach is unremarkable. No gastric wall thickening. No evidence of outlet obstruction. Duodenum is normally positioned as is the ligament of Treitz. No small bowel or colonic dilatation within the visualized abdomen. Diverticular changes are noted in the sigmoid colon. Vascular/Lymphatic: Portal vein, superior mesenteric vein and splenic vein are patent. Postcontrast imaging is motion degraded, but the pancreatic head mass does abut and is contiguous with the portal splenic confluence over a distance of approximately 2.4 cm. No evidence for abnormal soft tissue around the celiac axis. Fat planes around the common  hepatic artery appear preserved. Pancreatic head mass does not abut or in case the superior mesenteric artery. No abdominal lymphadenopathy Other:  No intraperitoneal free fluid. Musculoskeletal: No focal suspicious marrow enhancement within the visualized bony anatomy. IMPRESSION: 1. 2.9 x 2.4 x 2.6 cm soft tissue lesion in the head of the pancreas consistent with pancreatic adenocarcinoma. The lesion abruptly obstructs the common bile duct and the pancreatic duct. The pancreatic head mass abuts and slightly attenuates the portal splenic confluence but there is no evidence for involvement of the celiac axis or SMA. 2. Portal vein, superior mesenteric vein, and splenic vein are patent. No evidence for retroperitoneal lymphadenopathy in the visualized abdomen. 3. Gallbladder sludge without gallstones. 4. Bilateral renal cysts. Electronically Signed   By: Misty Stanley M.D.   On: 04/20/2021 20:15   MRCP, ERCP with a stent.   Subjective: Patient was seen and examined at bedside.  Overnight events noted.  Patient reports feeling much improved.  He reports pain is resolved.  Jaundice is improved,  labs are improving.  Patient is cleared from GI  and patient want to be discharged.  Discharge Exam: Vitals:   04/22/21 0858 04/22/21 1241  BP: (!) 145/75 (!) 130/56  Pulse: (!) 53 61  Resp: 17 16  Temp: 98.4 F (36.9  C) 98.3 F (36.8 C)  SpO2: 95% 94%   Vitals:   04/22/21 0435 04/22/21 0636 04/22/21 0858 04/22/21 1241  BP: (!) 137/57 (!) 150/58 (!) 145/75 (!) 130/56  Pulse: (!) 41 (!) 58 (!) 53 61  Resp: 12  17 16   Temp: (!) 97.4 F (36.3 C) 98.1 F (36.7 C) 98.4 F (36.9 C) 98.3 F (36.8 C)  TempSrc: Oral Oral Oral   SpO2: 93%  95% 94%  Weight:      Height:        General: Pt is alert, awake, not in acute distress Cardiovascular: RRR, S1/S2 +, no rubs, no gallops Respiratory: CTA bilaterally, no wheezing, no rhonchi Abdominal: Soft, NT, ND, bowel sounds + Extremities: no edema, no  cyanosis    The results of significant diagnostics from this hospitalization (including imaging, microbiology, ancillary and laboratory) are listed below for reference.     Microbiology: Recent Results (from the past 240 hour(s))  Culture, Urine     Status: Abnormal   Collection Time: 04/19/21 11:33 PM   Specimen: Urine, Clean Catch  Result Value Ref Range Status   Specimen Description   Final    URINE, CLEAN CATCH Performed at [facility], [place] [address], [place], [place] [address]    Special Requests   Final    NONE Performed at [facility], [place] [address] [place], [place] [address]    Culture (A)  Final    <10,000 COLONIES/mL INSIGNIFICANT GROWTH Performed at [place] [address] [place], [place] [address]    Report Status 04/21/2021 FINAL  Final  SARS CORONAVIRUS 2 (TAT 6-24 HRS) Nasopharyngeal Nasopharyngeal Swab     Status: None   Collection Time: 04/20/21 12:48 AM   Specimen: Nasopharyngeal Swab  Result Value Ref Range Status   SARS Coronavirus 2 NEGATIVE NEGATIVE Final    Comment: (NOTE) SARS-CoV-2 target nucleic acids are NOT DETECTED.  The SARS-CoV-2 RNA is generally detectable in upper and lower respiratory specimens during the acute phase of infection. Negative results do not preclude SARS-CoV-2 infection, do not rule out co-infections with other pathogens, and should not be used as the sole basis for treatment or other patient management decisions. Negative results must be combined with clinical observations, patient history, and epidemiological information. The expected result is Negative.  Fact Sheet for Patients: SugarRoll.be  Fact Sheet for Healthcare Providers: https://www.woods-mathews.com/  This test is not yet approved or cleared by the [place] FDA and  has been authorized for detection and/or diagnosis of SARS-CoV-2 by FDA under an Emergency  Use Authorization (EUA). This EUA will remain  in effect (meaning this test can be used) for the duration of the COVID-19 declaration under Se ction 564(b)(1) of the Act, 21 U.S.C. section 360bbb-3(b)(1), unless the authorization is terminated or revoked sooner.  Performed at [facility], [place] [address] [place], [place] [address]      Labs: BNP (last 3 results) No results for input(s): BNP in the last 8760 hours. Basic Metabolic Panel: Recent Labs  Lab 04/19/21 2059 04/20/21 0614 04/21/21 0532 04/21/21 0640 04/22/21 0503  NA 143 143 142  --  140  K 3.0* 3.1* 2.9*  --  4.2  CL 107 114* 111  --  110  CO2 27 23 23   --  20*  GLUCOSE 139* 119* 89  --  156*  BUN 25* 23 13  --  19  CREATININE 1.76* 1.70* 1.24  --  1.46*  CALCIUM 8.8* 8.1* 8.5*  --  8.4*  MG  --  1.8  1.9  --  1.7 1.6*  PHOS  --  2.5  --   --  3.3   Liver Function Tests: Recent Labs  Lab 04/19/21 2059 04/20/21 0614 04/21/21 0532 04/22/21 0503  AST 554* 491* 442* 327*  ALT 549* 483* 400* 333*  ALKPHOS 647* 615* 616* 591*  BILITOT 8.6* 7.0* 7.0* 4.1*  PROT 7.4 6.3* 6.1* 5.9*  ALBUMIN 3.8 3.2* 3.0* 2.8*   Recent Labs  Lab 04/20/21 0614  LIPASE 44   No results for input(s): AMMONIA in the last 168 hours. CBC: Recent Labs  Lab 04/19/21 2059 04/20/21 0557 04/21/21 0532 04/22/21 0503  WBC 6.8 6.0 5.6 4.7  NEUTROABS 4.9 4.0  --   --   HGB 12.8* 11.9* 11.8* 11.4*  HCT 37.9* 34.5* 35.0* 33.5*  MCV 93.1 92.7 92.6 94.1  PLT 151 144* 137* 123*   Cardiac Enzymes: Recent Labs  Lab 04/20/21 0614  CKTOTAL 208   BNP: Invalid input(s): POCBNP CBG: No results for input(s): GLUCAP in the last 168 hours. D-Dimer No results for input(s): DDIMER in the last 72 hours. Hgb A1c No results for input(s): HGBA1C in the last 72 hours. Lipid Profile No results for input(s): CHOL, HDL, LDLCALC, TRIG, CHOLHDL, LDLDIRECT in the last 72 hours. Thyroid function studies Recent Labs    04/20/21 0614   TSH 2.696   Anemia work up No results for input(s): VITAMINB12, FOLATE, FERRITIN, TIBC, IRON, RETICCTPCT in the last 72 hours. Urinalysis    Component Value Date/Time   COLORURINE AMBER (A) 04/19/2021 2134   APPEARANCEUR HAZY (A) 04/19/2021 2134   LABSPEC 1.017 04/19/2021 2134   PHURINE 5.0 04/19/2021 2134   GLUCOSEU NEGATIVE 04/19/2021 2134   HGBUR MODERATE (A) 04/19/2021 2134   BILIRUBINUR SMALL (A) 04/19/2021 2134   KETONESUR 5 (A) 04/19/2021 2134   PROTEINUR 30 (A) 04/19/2021 2134   NITRITE NEGATIVE 04/19/2021 2134   LEUKOCYTESUR NEGATIVE 04/19/2021 2134   Sepsis Labs Invalid input(s): PROCALCITONIN,  WBC,  LACTICIDVEN Microbiology Recent Results (from the past 240 hour(s))  Culture, Urine     Status: Abnormal   Collection Time: 04/19/21 11:33 PM   Specimen: Urine, Clean Catch  Result Value Ref Range Status   Specimen Description   Final    URINE, CLEAN CATCH Performed at [facility], [place] [address], [place], [place] [address]    Special Requests   Final    NONE Performed at [facility], [place] [address], [place], [place] [address]    Culture (A)  Final    <10,000 COLONIES/mL INSIGNIFICANT GROWTH Performed at [facility] [address] [place],  [address]    Report Status 04/21/2021 FINAL  Final  SARS CORONAVIRUS 2 (TAT 6-24 HRS) Nasopharyngeal Nasopharyngeal Swab     Status: None   Collection Time: 04/20/21 12:48 AM   Specimen: Nasopharyngeal Swab  Result Value Ref Range Status   SARS Coronavirus 2 NEGATIVE NEGATIVE Final    Comment: (NOTE) SARS-CoV-2 target nucleic acids are NOT DETECTED.  The SARS-CoV-2 RNA is generally detectable in upper and lower respiratory specimens during the acute phase of infection. Negative results do not preclude SARS-CoV-2 infection, do not rule out co-infections with other pathogens, and should not be used as the sole basis for treatment or other patient management  decisions. Negative results must be combined with clinical observations, patient history, and epidemiological information.  The expected result is Negative.  Fact Sheet for Patients: SugarRoll.be  Fact Sheet for Healthcare Providers: https://www.woods-mathews.com/  This test is not yet approved or cleared by the [place] FDA and  has been authorized for detection and/or diagnosis of SARS-CoV-2 by FDA under an Emergency Use Authorization (EUA). This EUA will remain  in effect (meaning this test can be used) for the duration of the COVID-19 declaration under Se ction 564(b)(1) of the Act, 21 U.S.C. section 360bbb-3(b)(1), unless the authorization is terminated or revoked sooner.  Performed at [facility], [place] [address], [place], [place] [address]      Time coordinating discharge: Over 30 minutes  SIGNED:   Shawna Clamp, MD  [facility] 04/22/2021, 2:25 PM Pager   If 7PM-7AM, please contact night-coverage [email or web address]

## 2021-04-23 ENCOUNTER — Encounter (HOSPITAL_COMMUNITY): Payer: Self-pay | Admitting: Gastroenterology

## 2021-04-25 ENCOUNTER — Other Ambulatory Visit: Payer: Self-pay | Admitting: Gastroenterology

## 2021-04-25 LAB — CYTOLOGY - NON PAP

## 2021-04-26 NOTE — Progress Notes (Signed)
Attempted to reach patient and his wife Juliann Pulse answered the phone.  She was unaware of his upcoming EUS by Dr. Paulita Fujita on 05/02/2021, was aware of his consult with Dr. Zenia Resides on 6/15.  I explained we needed to set him up to be seen by medical oncology and she did not want to schedule anything at this point.  She stated they may be going to [facility] (as they know some people) and she will call me back with their decision.

## 2021-05-02 ENCOUNTER — Ambulatory Visit (HOSPITAL_COMMUNITY): Admission: RE | Admit: 2021-05-02 | Payer: Medicare Other | Source: Home / Self Care | Admitting: Gastroenterology

## 2021-05-02 ENCOUNTER — Encounter (HOSPITAL_COMMUNITY): Admission: RE | Payer: Self-pay | Source: Home / Self Care

## 2021-05-02 ENCOUNTER — Encounter (HOSPITAL_COMMUNITY): Payer: Self-pay | Admitting: Certified Registered"

## 2021-05-02 SURGERY — UPPER ENDOSCOPIC ULTRASOUND (EUS) LINEAR
Anesthesia: Monitor Anesthesia Care | Laterality: Bilateral

## 2022-05-19 IMAGING — CR DG CHEST 2V
2 series · 2 of 2 positions shown · non-contrast
Comparison: None.

CLINICAL DATA: Pancreatic mass

EXAM:
CHEST - 2 VIEW

[w chest lat]
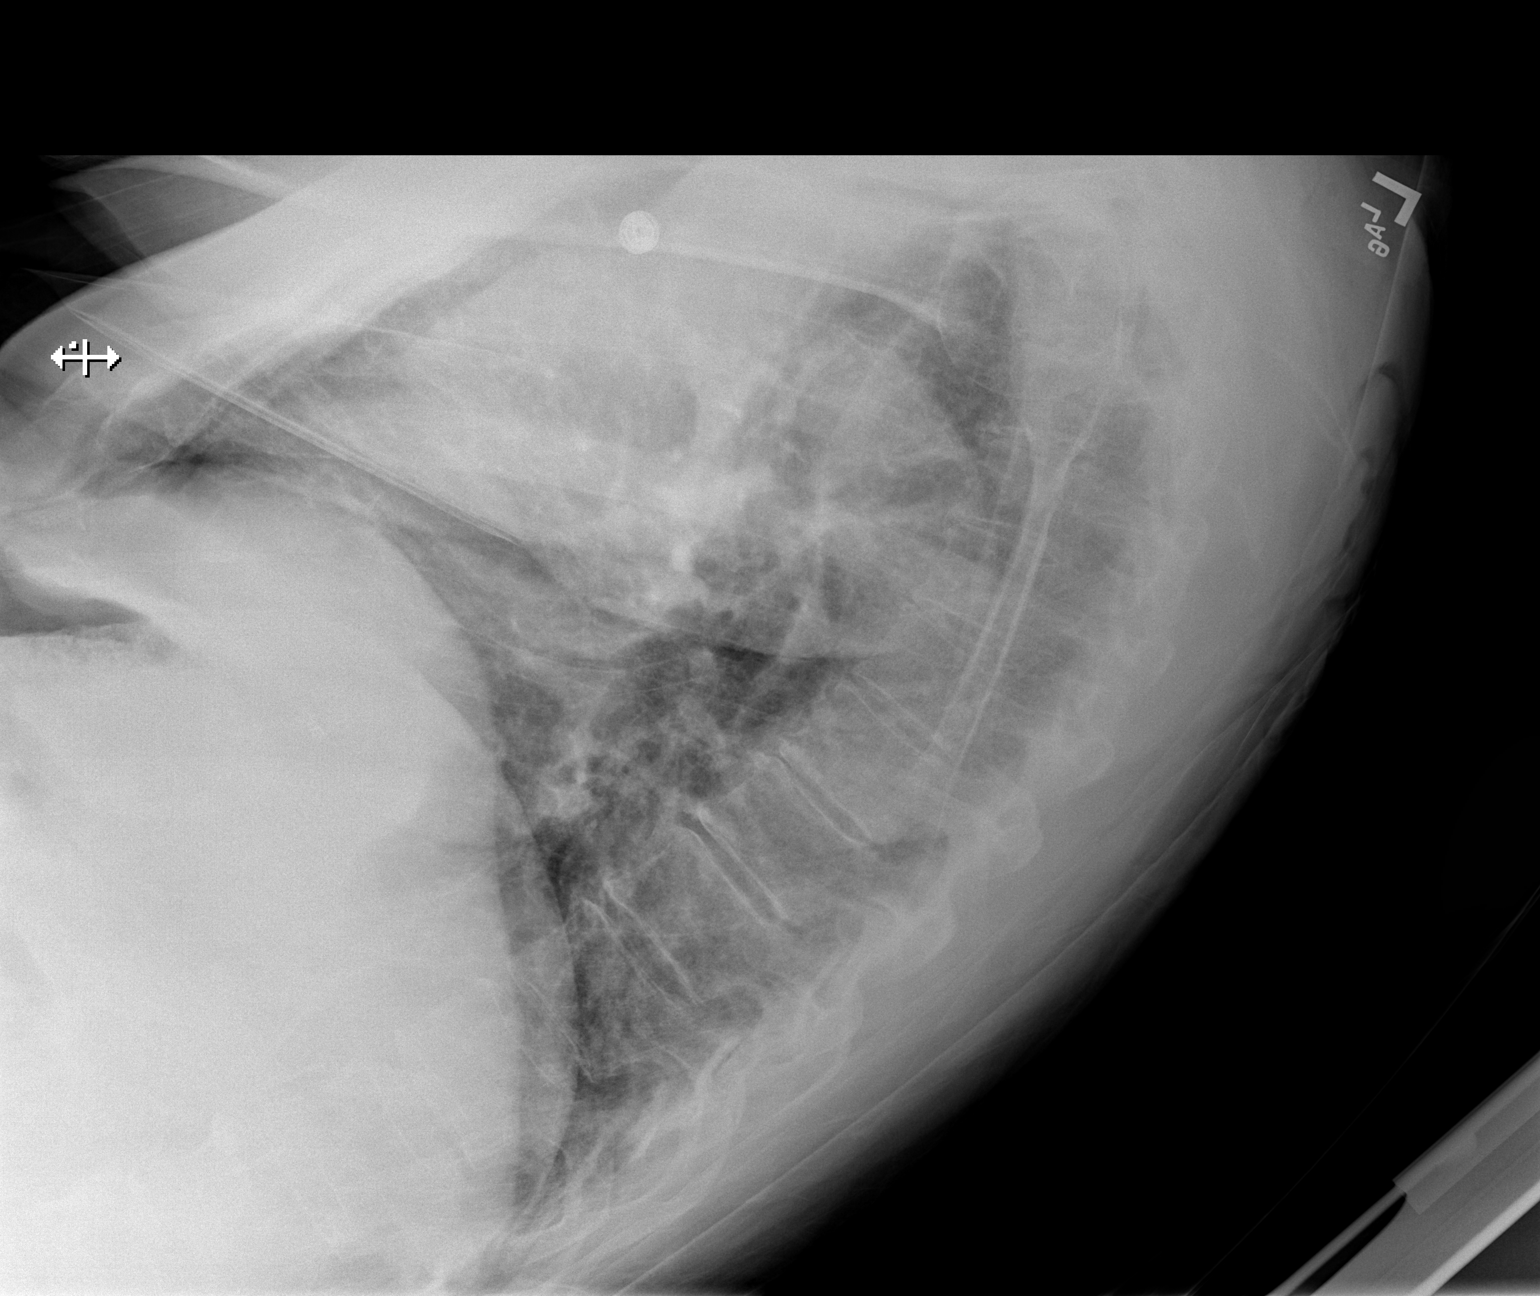

[x chest ap]
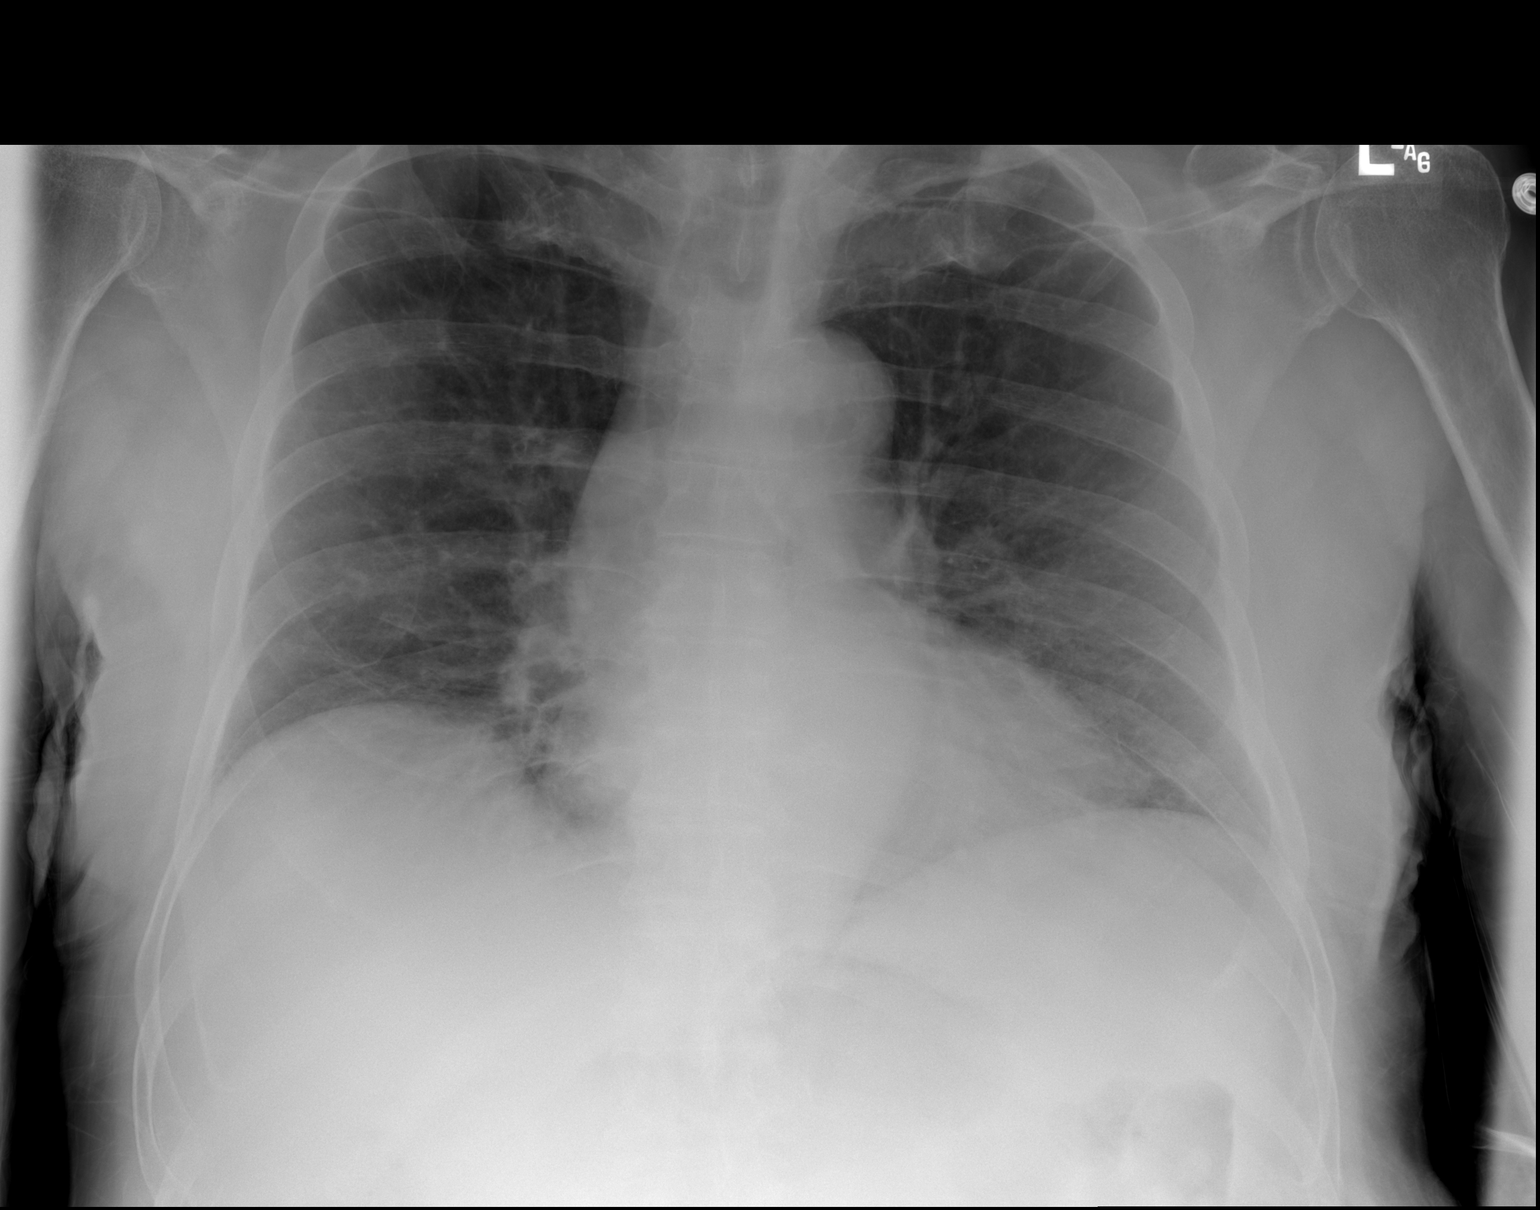

[2 of 2 positions shown; findings below may reference images not displayed]

FINDINGS: The lungs are symmetrically expanded. A 9 mm indeterminate nodule is
seen within the a right apex. The lungs are otherwise clear. No
pneumothorax or pleural effusion. Cardiac size within normal limits.
Pulmonary vascularity is normal. Osseous structures are
age-appropriate. No acute bone abnormality.
IMPRESSION: 9 mm indeterminate right upper lobe pulmonary nodule. Dedicated CT
imaging is recommended for further evaluation if no prior
examinations are available to establish chronicity.

## 2022-05-20 IMAGING — RF DG ERCP WO/W SPHINCTEROTOMY
1 series · 9 of 9 positions shown · non-contrast
Comparison: MRCP from previous day

CLINICAL DATA: Pancreatic head lesion

EXAM:
ERCP
TECHNIQUE: Multiple spot images obtained with the fluoroscopic device and
submitted for interpretation post-procedure.

[Series 1: run · 9 of 9 slices shown]
[im 1/9]
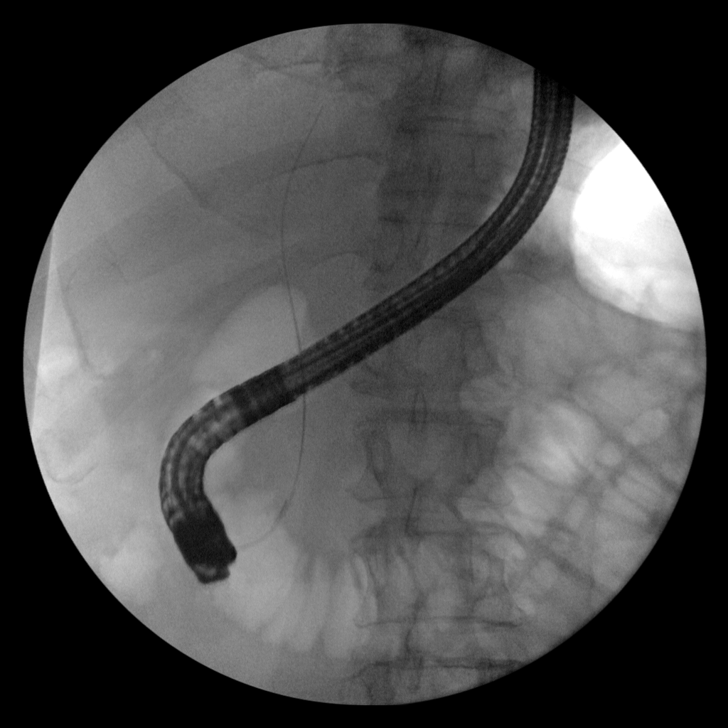
[im 2/9]
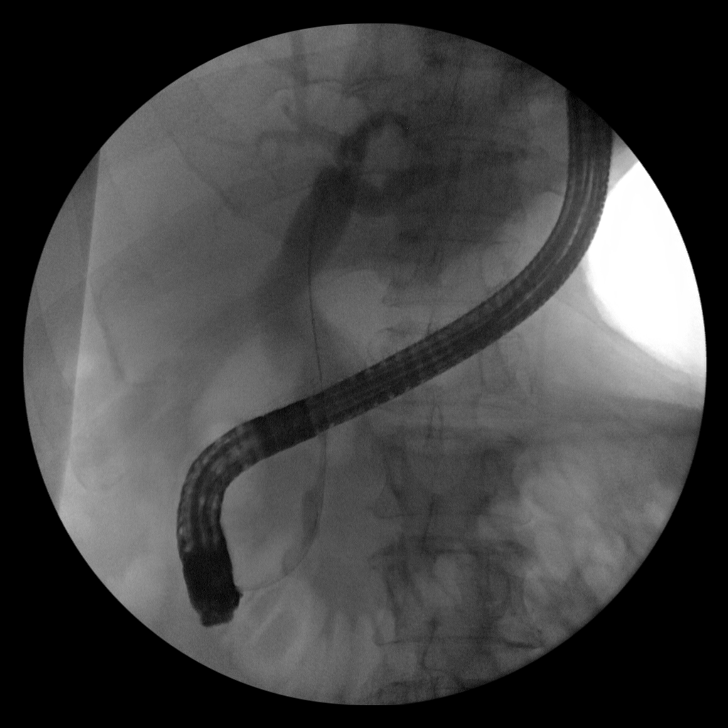
[im 3/9]
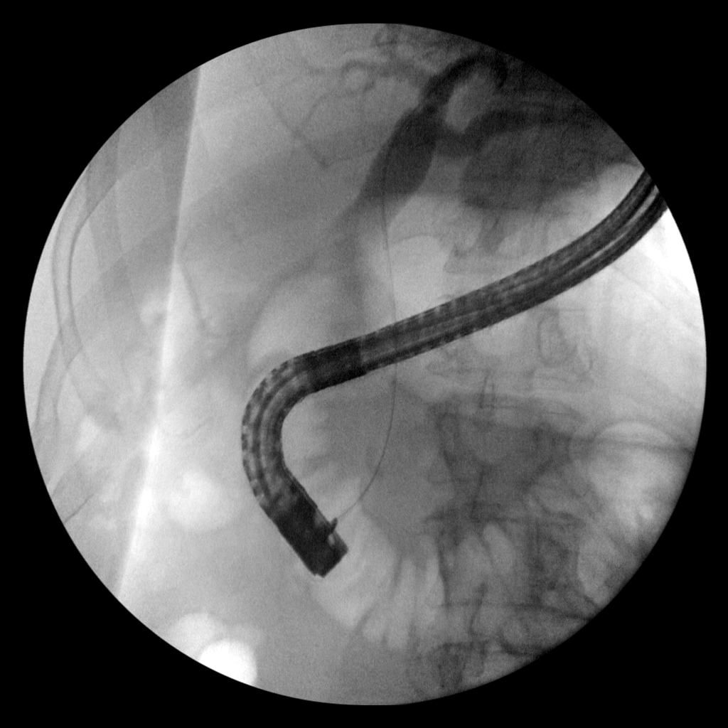
[im 4/9]
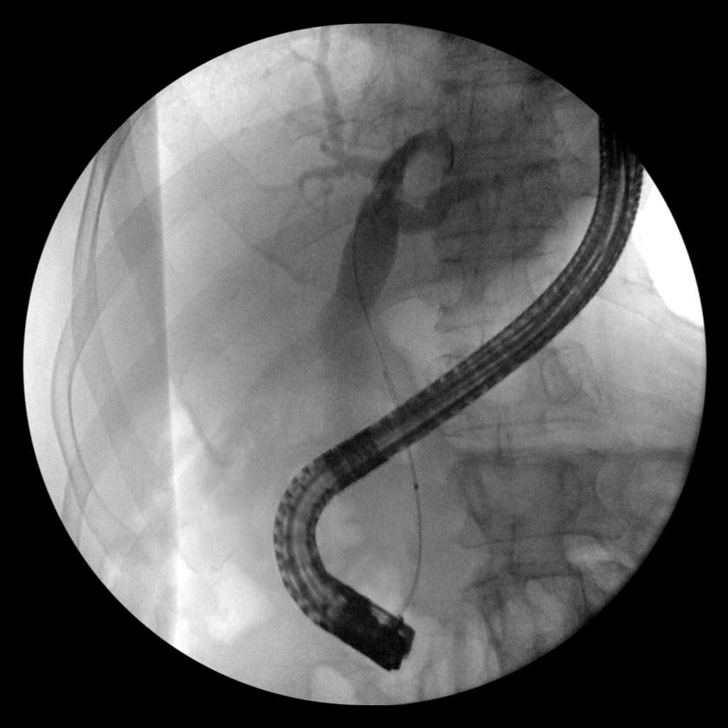
[im 5/9]
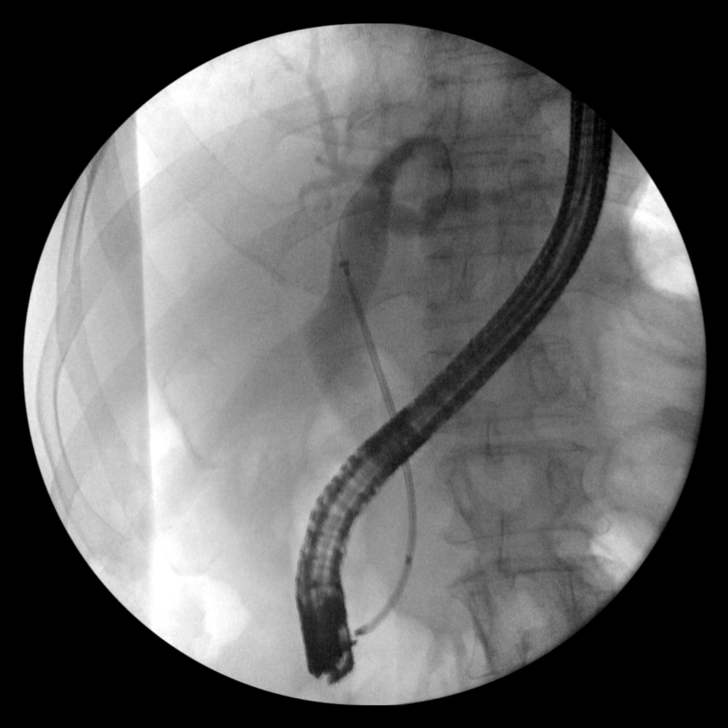
[im 6/9]
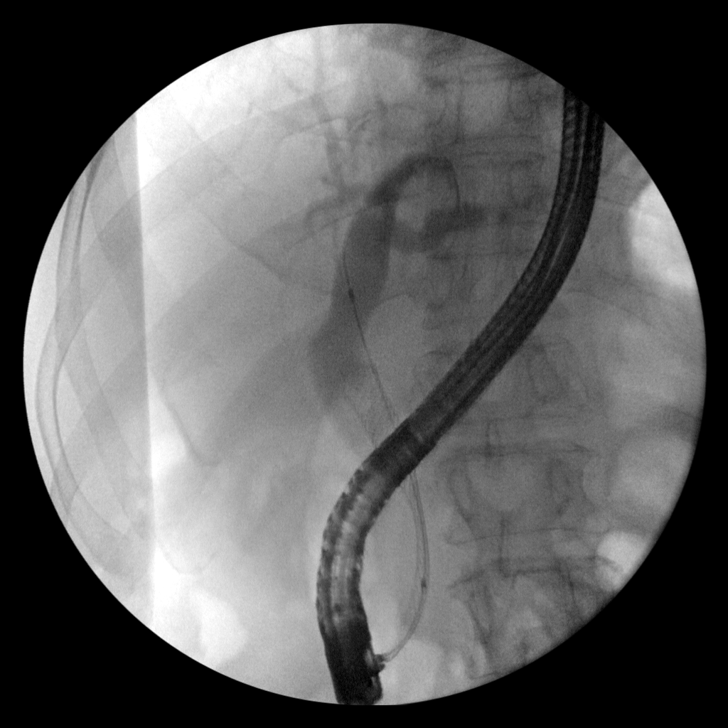
[im 7/9]
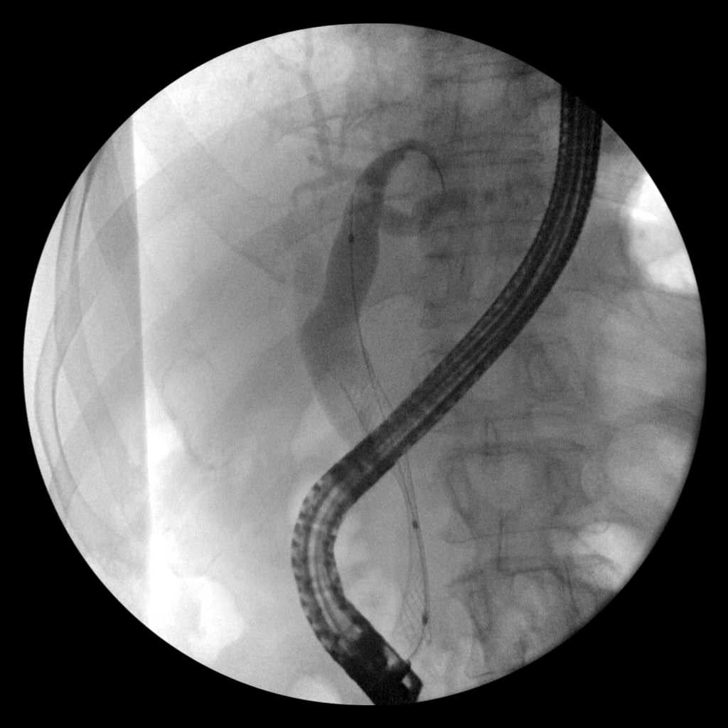
[im 8/9]
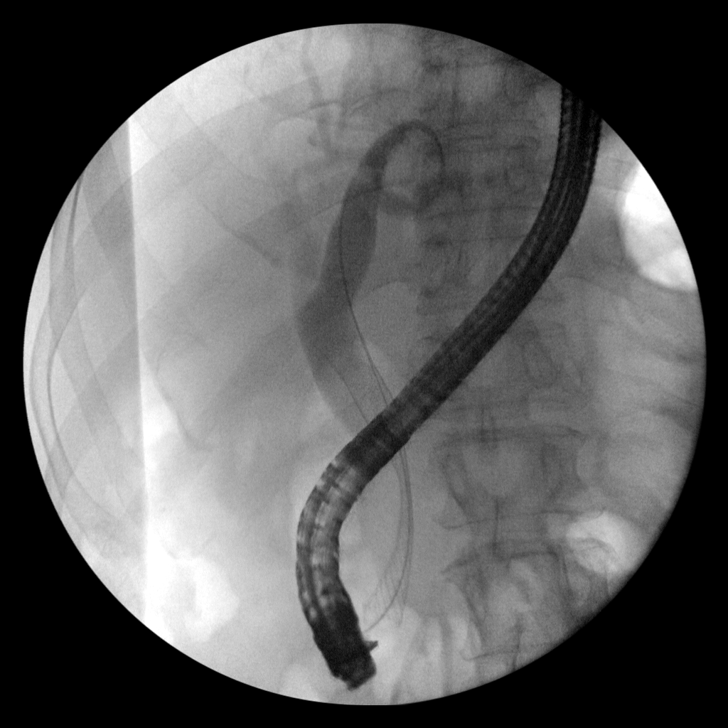
[im 9/9]
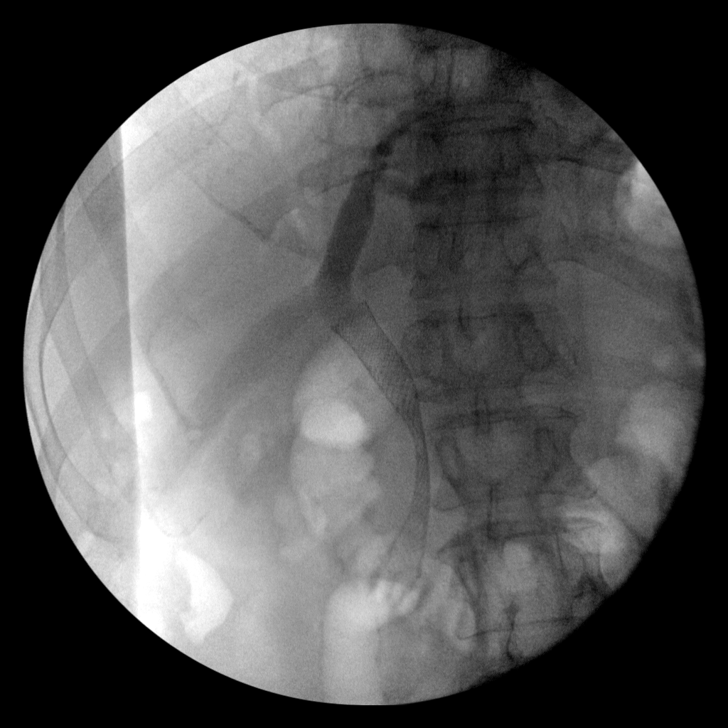

[9 of 9 positions shown; findings below may reference images not displayed]

FINDINGS: A series of fluoroscopic images document endoscopic cannulation and
opacification of the CBD, passage of balloon catheter into the
distal CBD, and metallic stent deployment across the distal CBD.
There is incomplete opacification of the CBD which appears dilated
proximally near the biliary confluence. There is incomplete
opacification of the biliary tree which appears mildly dilated
centrally. No evidence of extravasation. Persistent tapered
narrowing in the midportion of the stent on the final image.
IMPRESSION: Endoscopic balloon angioplasty and stenting of distal CBD lesion.

These images were submitted for radiologic interpretation only.
Please see the procedural report for the amount of contrast and the
fluoroscopy time utilized.

## 2022-07-27 DEATH — deceased
